# Patient Record
Sex: Female | Born: 1960
Health system: Southern US, Community
[De-identification: ages and names within clinical notes are randomized; demographics above are authoritative.]

## PROBLEM LIST (undated history)

## (undated) DIAGNOSIS — Z87898 Personal history of other specified conditions: Secondary | ICD-10-CM

## (undated) DIAGNOSIS — R87618 Other abnormal cytological findings on specimens from cervix uteri: Secondary | ICD-10-CM

## (undated) DIAGNOSIS — Z8619 Personal history of other infectious and parasitic diseases: Secondary | ICD-10-CM

## (undated) DIAGNOSIS — I10 Essential (primary) hypertension: Secondary | ICD-10-CM

## (undated) HISTORY — DX: Personal history of other infectious and parasitic diseases: Z86.19

## (undated) HISTORY — DX: Other abnormal cytological findings on specimens from cervix uteri: R87.618

## (undated) HISTORY — PX: CHOLECYSTECTOMY: SHX55

## (undated) HISTORY — DX: Personal history of other specified conditions: Z87.898

---

## 1999-01-30 ENCOUNTER — Encounter: Payer: Self-pay | Admitting: Internal Medicine

## 1999-01-30 ENCOUNTER — Ambulatory Visit (HOSPITAL_COMMUNITY): Admission: RE | Admit: 1999-01-30 | Discharge: 1999-01-30 | Payer: Self-pay | Admitting: Internal Medicine

## 1999-02-12 ENCOUNTER — Ambulatory Visit (HOSPITAL_COMMUNITY): Admission: RE | Admit: 1999-02-12 | Discharge: 1999-02-12 | Payer: Self-pay | Admitting: Internal Medicine

## 1999-02-12 ENCOUNTER — Encounter: Payer: Self-pay | Admitting: Internal Medicine

## 2003-03-20 ENCOUNTER — Encounter: Payer: Self-pay | Admitting: Internal Medicine

## 2003-03-20 ENCOUNTER — Ambulatory Visit (HOSPITAL_COMMUNITY): Admission: RE | Admit: 2003-03-20 | Discharge: 2003-03-20 | Payer: Self-pay | Admitting: Internal Medicine

## 2004-06-21 HISTORY — PX: OTHER SURGICAL HISTORY: SHX169

## 2004-07-09 ENCOUNTER — Encounter (INDEPENDENT_AMBULATORY_CARE_PROVIDER_SITE_OTHER): Payer: Self-pay | Admitting: *Deleted

## 2004-07-09 ENCOUNTER — Observation Stay (HOSPITAL_COMMUNITY): Admission: EM | Admit: 2004-07-09 | Discharge: 2004-07-10 | Payer: Self-pay | Admitting: Emergency Medicine

## 2005-04-02 ENCOUNTER — Emergency Department (HOSPITAL_COMMUNITY): Admission: EM | Admit: 2005-04-02 | Discharge: 2005-04-02 | Payer: Self-pay | Admitting: Emergency Medicine

## 2005-05-11 ENCOUNTER — Other Ambulatory Visit: Admission: RE | Admit: 2005-05-11 | Discharge: 2005-05-11 | Payer: Self-pay | Admitting: Obstetrics and Gynecology

## 2005-11-24 ENCOUNTER — Ambulatory Visit: Payer: Self-pay | Admitting: Hematology & Oncology

## 2005-12-28 LAB — HYPERCOAGULABLE PANEL, COMPREHENSIVE
AntiThromb III Func: 89 % (ref 75–120)
Anticardiolipin IgA: <7 [APL'U] (ref <13)
Anticardiolipin IgG: <7 [GPL'U] (ref <11)
Anticardiolipin IgM: 14 [MPL'U] (ref <10)
Beta-2 Glyco I IgG: 4 U/mL (ref <20)
Beta-2-Glycoprotein I IgA: <4 U/mL (ref <10)
Beta-2-Glycoprotein I IgM: <4 U/mL (ref <10)
DRVVT: 39.2 secs (ref 26.75–42.95)
Homocysteine: 13.6 umol/L (ref 4.0–15.4)
PTT Lupus Anticoagulant: 41.3 secs (ref 30.5–43.1)
Protein C Activity: 144 % (ref 91–147)
Protein C, Total: 76 % (ref 63–153)
Protein S Activity: 80 % — ABNORMAL LOW (ref 81–180)
Protein S Ag, Total: 85 % (ref 58–146)

## 2006-06-21 DIAGNOSIS — R87618 Other abnormal cytological findings on specimens from cervix uteri: Secondary | ICD-10-CM

## 2006-06-21 HISTORY — DX: Other abnormal cytological findings on specimens from cervix uteri: R87.618

## 2009-10-13 ENCOUNTER — Emergency Department (HOSPITAL_COMMUNITY): Admission: EM | Admit: 2009-10-13 | Discharge: 2009-10-13 | Payer: Self-pay | Admitting: Family Medicine

## 2010-11-06 NOTE — Op Note (Signed)
NAMECHONTEL, WARNING NO.:  [id number]   MEDICAL RECORD NO.:  [id number]          PATIENT TYPE:  INP   LOCATION:  0101                         FACILITY:  [facility]   PHYSICIAN:  Angelia Mould. Derrell Lolling, M.D.DATE OF BIRTH:  [birth year]   DATE OF PROCEDURE:  07/09/2004  DATE OF DISCHARGE:                                 OPERATIVE REPORT   PREOPERATIVE DIAGNOSIS:  Chronic cholecystitis with cholelithiasis.   POSTOPERATIVE DIAGNOSIS:  Chronic cholecystitis with cholelithiasis.   OPERATION/PROCEDURE:  Laparoscopic cholecystectomy with intraoperative  cholangiogram.   SURGEON:  Angelia Mould. Derrell Lolling, M.D.   FIRST ASSISTANT:  Currie Paris, M.D.   OPERATIVE INDICATIONS:  This is a 50 year old black female who presented to  emergency room with a several-hour history of severe epigastric and right  upper quadrant, nausea and vomiting.  The pain improved somewhat when given  narcotic analgesics but did not resolve.  She was found to have tenderness  in the right upper quadrant although not severe.  Liver function tests and  white blood cell count and lipase are normal.  Gallbladder ultrasound shows  numerous gallstones and a normal common bile duct.  She was observed for  several hours and continued to have pain. She was admitted to the hospital  and brought to the operating room.   OPERATIVE FINDINGS:  The gallbladder was edematous and acutely inflamed.  It  was very dense with intense inflammatory reaction making the dissection very  slow and tedious.  It was filled with stones of varying sizes up to 1.5 cm  in size.  The cholangiogram showed normal intrahepatic and extrahepatic  biliary anatomy, no leak, no obstruction, prompt flow of contrast into the  duodenum and probably no filling defects.  There was one transient filling  defect in the common hepatic duct which we thought was air bubbles and so  did Dr. Ronney Asters in radiology.  We felt nothing further needed to be  done  about that.  The liver was otherwise healthy.  The small bowel and large  bowel, stomach, duodenum and peritoneal surfaces looked normal to  inspection.   DESCRIPTION OF PROCEDURE:  Following the induction of general endotracheal  anesthesia, the patient's abdomen was prepped and draped in the sterile  fashion.  Marcaine 0.5% with epinephrine was used as a local infiltration  anesthetic.  A vertically oriented incision was made inside the lower rim of  the umbilicus.  The fascia was incised in the midline and the abdominal  cavity entered under direct vision.  A 10 mm Hasson trocar was inserted and  secured with a pursestring of 0 Vicryl.  Pneumoperitoneum was created.  Video camera was inserted with visualization and findings as described  above.   A 10 mm trocar was placed in the subxiphoid region and two 5 mm trocars were  placed in the right mid abdomen.  We ultimately had to place a third 5 mm  trocar in the right upper quadrant because of the patient's very large body  habitus.  The gallbladder fundus was carefully identified and elevated. We  then identified the  infundibulum of the gallbladder and dissected adhesions  away from this.  We dissected out the cystic duct creating a nice window  behind it.  A metal clip was placed on the cystic duct close to the  gallbladder.  The cystic duct was opened and a cholangiogram catheter was  inserted.  The cholangiogram was performed with unremarkable findings as  described above.  We had no leak, no obstruction, no clearcut filling  defects, and good anatomy both inside and outside the liver.  The  cholangiogram was removed.  The cystic duct was secured with multiple metal  clips and divided.  We carefully dissected out the cystic artery, controlled  it with multiple metal clips and divided close to the gallbladder.  The  gallbladder was dissected from its bed with electrocautery, placed in a  specimen bag and removed.  The  operative field was copiously irrigated.  We  did have a little bit of bleeding in the gallbladder bed and we also had a  small capsular tear of the dome of the liver, both of which had to be  controlled with cautery and we placed a piece of Surgicel on the dome of the  liver as well and that seemed to control the bleeding quite nicely.  At the  end of the case there was no bleeding and the irrigation fluid was  completely clear.  No bile leak either.  The trocars were removed under  direct vision.  There was no bleeding from the trocar sites.  The  pneumoperitoneum was released.  The fascia at the umbilicus was closed with  0 Vicryl sutures.  Skin incisions were closed with subcuticular sutures of 4-  0 Vicryl and Steri-Strips.  Kling bandages were placed.  The patient was  taken to the recovery room in stable condition.  Estimated blood loss was  about 40 mL.  Complications were none.  Sponge, needle and instrument counts  were correct.      HMI/MEDQ  D:  07/09/2004  T:  07/09/2004  Job:  [id number]   cc:   Candyce Churn. Allyne Gee, M.D.  [address]  [address]  [place]  [place] [address]  Fax: [phone]

## 2010-11-06 NOTE — H&P (Signed)
Donna Crawford, Donna Crawford NO.:  [id number]   MEDICAL RECORD NO.:  [id number]          PATIENT TYPE:  EMS   LOCATION:  ED                           FACILITY:  [facility]   PHYSICIAN:  Angelia Mould. Derrell Lolling, M.D.DATE OF BIRTH:  10/12/1960   DATE OF ADMISSION:  07/09/2004  DATE OF DISCHARGE:                                HISTORY & PHYSICAL   CHIEF COMPLAINT:  Abdominal pain and nausea.   HISTORY OF PRESENT ILLNESS:  This is a 50 year old black female who came  home from work last night, and at about 1:00 a.m. noted the rather sudden  onset of epigastric and right upper quadrant pain.  She said this was the  worst pain she ever had.  She vomited four times, had one small soft bowel  movement, and came to the emergency room at about 4:30 in the morning.  She  thinks she had a similar episode about one week ago, but it was much milder.   While in the ER, she has been given analgesics and states that the pain is  now about 4/10.  Her nausea has resolved.  She still has pain.  She is  currently on her menstrual cycle.  She is otherwise doing reasonably well.  She denies any history of liver disease, cardiovascular disease, urologic  disease, or lung disease.   While in the emergency room, she has had an ultrasound which showed numerous  gallstones but no acute changes.  Common bile duct measured 5.8 mm.   PAST HISTORY:  She has had oral surgery on her gums.  Otherwise, no surgery  or medical problems other than obesity.  She states she weighs 305 pounds.   CURRENT MEDICATIONS:  Hydrochlorothiazide to treat ankle edema.   DRUG ALLERGIES:  None known.   SOCIAL HISTORY:  She lives in [place].  She is single.  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  She has no  children.  She denies the use of alcohol or tobacco.   FAMILY HISTORY:  Mother living, has diabetes.  Father deceased of prostate  cancer.  Six siblings.  One sister died of  metastatic breast cancer.   REVIEW OF SYSTEMS:  A 15-system review of systems was performed.  It is  noncontributory, except as described above.   PHYSICAL EXAMINATION:  GENERAL:  Very pleasant middle-aged black female.  She is morbidly obese, in mild distress.  TEMPERATURE:  98.1.  BLOOD PRESSURE: 113/77.  PULSE:  77.  RESPIRATIONS:  18.  O2 SATURATION:  Between 97-100% on room air.  EYES:  Sclerae clear.  Extraocular movements intact.  EARS, NOSE, MOUTH, AND THROAT:  Nose, lips, tongue, and oropharynx without  gross lesions.  NECK:  Supple, nontender.  No mass.  LUNGS:  Clear to auscultation.  No chest wall tenderness.  HEART:  Regular rate and rhythm.  No murmur.  Radial and femoral pulses are  palpable.  She does have some ankle edema, and I cannot feel her pedal  pulses, but the feet are warm and well vascularized.  BREASTS:  Not  examined.  ABDOMEN:  Significantly obese.  No scars.  No palpable mass.  Liver and  spleen not obviously enlarged.  She is mildly tender in the epigastrium and  right upper quadrant, but there is no mass and no guarding.  EXTREMITIES:  She moves all four extremities well, without pain or  deformity.  Her ankles are somewhat thick, with mild edema.  NEUROLOGIC:  No gross motor or sensory deficits.   ADMISSION DATA:  Ultrasound shows gallstones and a normal common bile duct.  Hemoglobin 12.2, white count 7,600.  Potassium 3.4.  Liver function tests  normal.  BUN 9, creatinine 1, lipase 15.  Urinalysis shows a little bit of  blood, consistent with her menstrual period, but no white cells.   ASSESSMENT:  1.  Acute cholecystitis with cholelithiasis.  2.  Morbid obesity.   PLAN:  The patient will be admitted for a laparoscopic cholecystectomy.  This was her desire, to go ahead with surgery at this time rather than defer  to a later date.  I feel that this is medically appropriate.   I have discussed the indications and details of surgery with her.   Risks and  complications have been outlined, including but not limited to bleeding,  infection, conversion to open laparotomy, injury to adjacent organs such as  the main bile duct or intestine with major reconstructive surgery, wound  problems such as infection or hernia, cardiac, pulmonary, and thromboembolic  problems.  She seems to understand these issues well.  At this time, all her  questions were answered.  She would very much like to go ahead with this  plan today.      HMI/MEDQ  D:  07/09/2004  T:  07/09/2004  Job:  [id number]   cc:   Candyce Churn. Allyne Gee, M.D.  [address]  [address]  [place]  [place] [address]  Fax: [phone]

## 2011-04-13 ENCOUNTER — Inpatient Hospital Stay (INDEPENDENT_AMBULATORY_CARE_PROVIDER_SITE_OTHER)
Admission: RE | Admit: 2011-04-13 | Discharge: 2011-04-13 | Disposition: A | Discharge status: Home / Self Care | Payer: 59 | Source: Ambulatory Visit | Attending: Family Medicine | Admitting: Family Medicine

## 2011-04-13 DIAGNOSIS — IMO0002 Reserved for concepts with insufficient information to code with codable children: Secondary | ICD-10-CM

## 2011-11-05 ENCOUNTER — Other Ambulatory Visit: Payer: Self-pay | Admitting: Internal Medicine

## 2011-11-05 DIAGNOSIS — Z1231 Encounter for screening mammogram for malignant neoplasm of breast: Secondary | ICD-10-CM

## 2011-12-27 ENCOUNTER — Ambulatory Visit (INDEPENDENT_AMBULATORY_CARE_PROVIDER_SITE_OTHER): Payer: 59 | Admitting: Obstetrics and Gynecology

## 2011-12-27 ENCOUNTER — Encounter: Payer: Self-pay | Admitting: Obstetrics and Gynecology

## 2011-12-27 VITALS — BP 110/76 | Temp 98.3°F | Resp 16 | Ht 64.0 in | Wt 326.0 lb

## 2011-12-27 DIAGNOSIS — Z0000 Encounter for general adult medical examination without abnormal findings: Secondary | ICD-10-CM

## 2011-12-27 DIAGNOSIS — R8789 Other abnormal findings in specimens from female genital organs: Secondary | ICD-10-CM

## 2011-12-27 DIAGNOSIS — R87618 Other abnormal cytological findings on specimens from cervix uteri: Secondary | ICD-10-CM

## 2011-12-27 DIAGNOSIS — B977 Papillomavirus as the cause of diseases classified elsewhere: Secondary | ICD-10-CM

## 2011-12-27 NOTE — Progress Notes (Signed)
Last Pap: 05/2006 WNL: Yes Regular Periods:yes Contraception: None  Monthly Breast exam:yes Tetanus<28yrs:no Nl.Bladder Function:yes Daily BMs:yes Healthy Diet: sometimes Calcium:yes Mammogram:yes Date of Mammogram: 2008 Exercise:yes Have often Exercise: 2-3 times weekly Seatbelt: yes Abuse at home: no Stressful work:yes Sigmoid-colonoscopy: Never had Bone Density: Never had PCP: Dr. Allyne Gee Change in PMH: cholesterol levels and thyroid Change in [facility]: None  *Pt without complaints today Pt without complaints Physical Examination: General appearance - alert, well appearing, and in no distress Mental status - normal mood, behavior, speech, dress, motor activity, and thought processes Neck - supple, no significant adenopathy, thyroid exam: thyroid is normal in size without nodules or tenderness Chest - clear to auscultation, no wheezes, rales or rhonchi, symmetric air entry Heart - normal rate and regular rhythm Abdomen - soft, nontender, nondistended, no masses or organomegaly Breasts - breasts appear normal, pendulous, no suspicious masses, no skin or nipple changes or axillary nodes Pelvic - normal external genitalia, vulva, vagina, cervix, uterus and adnexa. Difficult to tell the size of uterus b/c of body habitus Rectal - normal rectal, no masses, rectal exam not indicated Back exam - full range of motion, no tenderness, palpable spasm or pain on motion Neurological - alert, oriented, normal speech, no focal findings or movement disorder noted Musculoskeletal - no joint tenderness, deformity or swelling Extremities - no edema, redness or tenderness in the calves or thighs Skin - normal coloration and turgor, no rashes, no suspicious skin lesions noted Routine exam Pap sent yes Mammogram due yes wil schedule pt with endometrial cells on pap in 2008.  she did not have embx done.  she declines i no but will do [place] @[place]  RT 4 weks

## 2011-12-27 NOTE — Addendum Note (Signed)
Addended by: Rolla Plate on: 12/27/2011 03:55 PM   Modules accepted: Orders

## 2011-12-28 LAB — PAP IG W/ RFLX HPV ASCU

## 2012-01-13 ENCOUNTER — Encounter: Payer: 59 | Admitting: Obstetrics and Gynecology

## 2012-01-13 ENCOUNTER — Other Ambulatory Visit: Payer: 59

## 2012-12-27 ENCOUNTER — Other Ambulatory Visit: Payer: Self-pay | Admitting: Gastroenterology

## 2013-01-26 ENCOUNTER — Ambulatory Visit (HOSPITAL_COMMUNITY)
Admission: RE | Admit: 2013-01-26 | Discharge: 2013-01-26 | Disposition: A | Discharge status: Home / Self Care | Payer: 59 | Source: Ambulatory Visit | Attending: Gastroenterology | Admitting: Gastroenterology

## 2013-01-26 ENCOUNTER — Encounter (HOSPITAL_COMMUNITY): Payer: Self-pay | Admitting: *Deleted

## 2013-01-26 ENCOUNTER — Encounter (HOSPITAL_COMMUNITY)
Admission: RE | Disposition: A | Discharge status: Home / Self Care | Payer: Self-pay | Source: Ambulatory Visit | Attending: Gastroenterology

## 2013-01-26 DIAGNOSIS — Z9089 Acquired absence of other organs: Secondary | ICD-10-CM | POA: Insufficient documentation

## 2013-01-26 DIAGNOSIS — K644 Residual hemorrhoidal skin tags: Secondary | ICD-10-CM | POA: Insufficient documentation

## 2013-01-26 DIAGNOSIS — I10 Essential (primary) hypertension: Secondary | ICD-10-CM | POA: Insufficient documentation

## 2013-01-26 DIAGNOSIS — D126 Benign neoplasm of colon, unspecified: Secondary | ICD-10-CM | POA: Insufficient documentation

## 2013-01-26 DIAGNOSIS — K5730 Diverticulosis of large intestine without perforation or abscess without bleeding: Secondary | ICD-10-CM | POA: Insufficient documentation

## 2013-01-26 DIAGNOSIS — Z1211 Encounter for screening for malignant neoplasm of colon: Secondary | ICD-10-CM | POA: Insufficient documentation

## 2013-01-26 DIAGNOSIS — K648 Other hemorrhoids: Secondary | ICD-10-CM | POA: Insufficient documentation

## 2013-01-26 HISTORY — PX: COLONOSCOPY: SHX5424

## 2013-01-26 HISTORY — DX: Essential (primary) hypertension: I10

## 2013-01-26 SURGERY — COLONOSCOPY
Anesthesia: Moderate Sedation

## 2013-01-26 MED ORDER — MIDAZOLAM HCL 10 MG/2ML IJ SOLN
INTRAMUSCULAR | Status: AC
  Filled 2013-01-26: qty 4

## 2013-01-26 MED ORDER — SODIUM CHLORIDE 0.9 % IV SOLN
INTRAVENOUS | Status: DC
  Administered 2013-01-26: 500 mL via INTRAVENOUS

## 2013-01-26 MED ORDER — DIPHENHYDRAMINE HCL 50 MG/ML IJ SOLN
INTRAMUSCULAR | Status: AC
  Filled 2013-01-26: qty 1

## 2013-01-26 MED ORDER — FENTANYL CITRATE 0.05 MG/ML IJ SOLN
INTRAMUSCULAR | Status: DC | PRN
  Administered 2013-01-26 (×3): 25 ug via INTRAVENOUS

## 2013-01-26 MED ORDER — FENTANYL CITRATE 0.05 MG/ML IJ SOLN
INTRAMUSCULAR | Status: AC
  Filled 2013-01-26: qty 4

## 2013-01-26 MED ORDER — MIDAZOLAM HCL 5 MG/5ML IJ SOLN
INTRAMUSCULAR | Status: DC | PRN
  Administered 2013-01-26 (×3): 2 mg via INTRAVENOUS

## 2013-01-26 NOTE — Op Note (Signed)
[facility] [address] [place], [address]   OPERATIVE PROCEDURE REPORT  PATIENT: Donna Crawford, Donna Crawford  MR#: [id number] BIRTHDATE: [birth year]  GENDER: Female ENDOSCOPIST: Jeani Hawking, MD ASSISTANT:   Priscella Mann, technician Karoline Caldwell, RN, BSN PROCEDURE DATE: 01/26/2013 PROCEDURE:   Colonoscopy with snare polypectomy ASA CLASS:   Class III INDICATIONS:Screening MEDICATIONS: Versed 6 mg IV and Fentanyl 75 mcg IV  DESCRIPTION OF PROCEDURE:   After the risks benefits and alternatives of the procedure were thoroughly explained, informed consent was obtained.  A digital rectal exam revealed no abnormalities of the rectum.    The Pentax Colonoscope [id number] endoscope was introduced through the anus  and advanced to the cecum, which was identified by both the appendix and ileocecal valve , No adverse events experienced.    The quality of the prep was excellent. .  The instrument was then slowly withdrawn as the colon was fully examined.     FINDINGS: A 3 mm sessile sigmoid colon polyp was removed with a cold snare.  Scattered left sided diverticula were noted.  No evidence of any masses, inflammation, ulcerations, or erosions.          The scope was then withdrawn from the patient and the procedure terminated.  COMPLICATIONS: There were no complications.  IMPRESSION: 1) Polyp. 2) Diverticula. 3) Int/Ext Hemorrhoids.  RECOMMENDATIONS: 1) Await biopsy results. 2) Repeat the colonoscopy in 5-10 years.  _______________________________ eSignedJeani Hawking, MD 01/26/2013 11:06 AM

## 2013-01-26 NOTE — H&P (Signed)
Reason for Consult: Screening colonoscopy Referring Physician: Dorothyann Peng, M.D.  Arlean Hopping HPI: This is a 52 year old female referred for a screening colonoscopy.  She is asymptomatic from the GI standpoint.  The patient is here today at the hospital because her BMI is at 53.  Past Medical History  Diagnosis Date  . H/O varicella   . History of measles, mumps, or rubella   . Non-atypical endometrial cells on cervical Pap smear 2008  . Hypertension     Past Surgical History  Procedure Laterality Date  . Gall bladder removed  2006  . Cholecystectomy      History reviewed. No pertinent family history.  Social History:  reports that she has never smoked. She has never used smokeless tobacco. She reports that  drinks alcohol. She reports that she does not use illicit drugs.  Allergies: No Known Allergies  Medications:  Scheduled:  Continuous: . sodium chloride 500 mL (01/26/13 0920)    No results found for this or any previous visit (from the past 24 hour(s)).   No results found.  ROS:  As stated above in the HPI otherwise negative.  Blood pressure 122/78, temperature 98.4 F (36.9 C), temperature source Oral, resp. rate 15, height 5\' 4"  (1.626 m), weight 324 lb (146.965 kg), last menstrual period 12/03/2012, SpO2 97.00%.    PE: Gen: NAD, Alert and Oriented HEENT:  [place]/AT, EOMI Neck: Supple, no LAD Lungs: CTA Bilaterally CV: RRR without M/G/R ABM: Soft, NTND, +BS Ext: No C/C/E  Assessment/Plan: 1) Screening colonoscopy.  Plan: 1) Colonoscopy today.  Demaria Deeney D 01/26/2013, 10:05 AM

## 2013-01-29 ENCOUNTER — Encounter (HOSPITAL_COMMUNITY): Payer: Self-pay | Admitting: Gastroenterology

## 2013-11-29 ENCOUNTER — Encounter: Payer: 59 | Attending: Internal Medicine | Admitting: Dietician

## 2013-11-29 ENCOUNTER — Encounter: Payer: Self-pay | Admitting: Dietician

## 2013-11-29 DIAGNOSIS — E119 Type 2 diabetes mellitus without complications: Secondary | ICD-10-CM | POA: Insufficient documentation

## 2013-11-29 DIAGNOSIS — Z713 Dietary counseling and surveillance: Secondary | ICD-10-CM | POA: Insufficient documentation

## 2013-11-29 NOTE — Progress Notes (Signed)
  Medical Nutrition Therapy:  Appt start time: 1115 end time:  1200.   Assessment:  Primary concerns today: Donna Crawford is here today stating that she is interested in improving her health overall. Diabetes runs in her family and she would like to prevent it. 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 She starts nursing school in the fall. Does not eat out as much lately and cooks more.   Preferred Learning Style:   No preference indicated   Learning Readiness:   Ready   MEDICATIONS: see list   DIETARY INTAKE:  24-hr recall:  B ( AM): skips  Snk ( AM):   L ( PM): cabbage, potatoes, and sausage or chili (tries to always incorporate a vegetable) Snk ( PM): frozen yogurt from Donna Crawford D ( PM):  Snk ( PM):   Beverages: soda rarely, water with flavoring, tea with Stevia  Usual physical activity: none  Estimated energy needs: 1600-1800 calories 180-200 g carbohydrates 120-135 g protein 44-50 g fat  Progress Towards Goal(s):  No progress.   Nutritional Diagnosis:  Donna Crawford-3.3 Overweight/obesity As related to sedentary lifestyle and excessive energy intake  As evidenced by BMI 54.    Intervention:  Nutrition counseling provided.  Teaching Method Utilized:  Visual Auditory Hands on  Handouts given during visit include:  MyPlate  65Y CHO + protein snacks  Barriers to learning/adherence to lifestyle change: schedule  Demonstrated degree of understanding via:  Teach Back   Monitoring/Evaluation:  Dietary intake, exercise, and body weight prn.

## 2013-11-29 NOTE — Patient Instructions (Addendum)
-  Start exercise routine!  -Walk outside  -Listen to music  -Goal: 3x a week for 30 min - increase time every week  -Consider wearing a pedometer and set a step goal  -Start eating breakfast every day  -Have something with a carb and a protein (refer to snack list)  -Fill up on non-starchy vegetables (any veggie that is not corn, peas, or potatoes)  -Carbs per day: 180-200g per day (about 3 servings per meal) -1 serving of carbs = 15 grams -Choose carbs high in fiber and low in sugar  -Pre-portion snacks -Continue not buying "trigger foods"

## 2015-06-14 ENCOUNTER — Ambulatory Visit (INDEPENDENT_AMBULATORY_CARE_PROVIDER_SITE_OTHER): Payer: 59 | Admitting: Family Medicine

## 2015-06-14 VITALS — BP 132/84 | HR 61 | Temp 97.5°F | Resp 18 | Ht 65.0 in | Wt 305.0 lb

## 2015-06-14 DIAGNOSIS — H6122 Impacted cerumen, left ear: Secondary | ICD-10-CM | POA: Diagnosis not present

## 2015-06-14 DIAGNOSIS — J029 Acute pharyngitis, unspecified: Secondary | ICD-10-CM

## 2015-06-14 MED ORDER — AMOXICILLIN 875 MG PO TABS: 875.0000 mg | ORAL_TABLET | Freq: Two times a day (BID) | ORAL | Status: DC

## 2015-06-14 NOTE — Progress Notes (Signed)
This chart was scribed for Robyn Haber, MD by [facility], medical scribe at [facility].The patient was seen in exam room 14 and the patient's care was started at 11:17 AM.  Patient ID: Donna Crawford MRN: [id number], DOB: 1961/02/28, 54 y.o. Date of Encounter: 06/14/2015  Primary Physician: [name], MD  Chief Complaint:  Chief Complaint  Patient presents with  . Sore Throat    last night very bad    HPI:  Donna Crawford is a 54 y.o. female who presents to [facility] complaining of sore throat since last night, taking tylenol for relief. No ear pain, but feels as though her left ear is obstructed with wax. 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   Past Medical History  Diagnosis Date  . H/O varicella   . History of measles, mumps, or rubella   . Non-atypical endometrial cells on cervical Pap smear 2008  . Hypertension     Home Meds: Prior to Admission medications   Medication Sig Start Date End Date Taking? Authorizing Provider  Cholecalciferol (VITAMIN D PO) Take by mouth.   Yes Historical Provider, MD  Javier Docker Oil 500 MG CAPS Take 600 mg by mouth daily.   Yes Historical Provider, MD  Melatonin 3 MG TABS Take by mouth.   Yes Historical Provider, MD  olmesartan-hydrochlorothiazide (BENICAR HCT) 20-12.5 MG per tablet Take 1 tablet by mouth daily.   Yes Historical Provider, MD   Allergies: No Known Allergies  Social History   Social History  . Marital Status: Married    Spouse Name: N/A  . Number of Children: N/A  . Years of Education: N/A   Occupational History  . Not on file.   Social History Main Topics  . Smoking status: Never Smoker   . Smokeless tobacco: Never Used  . Alcohol Use: Yes     Comment: Occasional wine  . Drug Use: No  . Sexual Activity: Not Currently    Birth Control/ Protection: Abstinence, None   Other Topics Concern  . Not on file   Social History Narrative    Review of  Systems: Constitutional: negative for chills, fever, night sweats, weight changes, or fatigue  HEENT: negative for vision changes, hearing loss, congestion, rhinorrhea, epistaxis, or sinus pressure. Positive for sore throat. Cardiovascular: negative for chest pain or palpitations Respiratory: negative for hemoptysis, wheezing, shortness of breath, or cough Abdominal: negative for abdominal pain, nausea, vomiting, diarrhea, or constipation Dermatological: negative for rash Neurologic: negative for headache, dizziness, or syncope All other systems reviewed and are otherwise negative with the exception to those above and in the HPI.  Physical Exam:  Blood pressure 132/84, pulse 61, temperature 97.5 F (36.4 C), temperature source Oral, resp. rate 18, height 5\' 5"  (1.651 m), weight 305 lb (138.347 kg), last menstrual period 12/03/2012, SpO2 97 %., Body mass index is 50.75 kg/(m^2). General: Well developed, well nourished, in no acute distress. Head: Normocephalic, atraumatic, eyes without discharge, sclera non-icteric, nares are without discharge. Left TM is obstructed with cerumen. Throat is erythematous.  Neck: Supple. No thyromegaly. Full ROM. No lymphadenopathy. Lungs: Clear bilaterally to auscultation without wheezes, rales, or rhonchi. Breathing is unlabored. Heart: RRR with S1 S2. No murmurs, rubs, or gallops appreciated. Abdomen: Soft, non-tender, non-distended with normoactive bowel sounds. No hepatomegaly. No rebound/guarding. No obvious abdominal masses. Msk:  Strength and tone normal for age. Extremities/Skin: Warm and dry. No clubbing or cyanosis. No edema. No rashes or  suspicious lesions. Neuro: Alert and oriented X 3. Moves all extremities spontaneously. Gait is normal. CNII-XII grossly in tact. Psych:  Responds to questions appropriately with a normal affect.    ASSESSMENT AND PLAN:  55 y.o. year old female with sore throat and left cerumen impaction  By signing my name below,  I, Nadim Abuhashem, attest that this documentation has been prepared under the direction and in the presence of Robyn Haber, MD.  Electronically Signed: Lora Havens, medical scribe. 06/14/2015 11:22 AM.  This chart was scribed in my presence and reviewed by me personally.    ICD-9-CM ICD-10-CM   1. Sore throat 462 J02.9 amoxicillin (AMOXIL) 875 MG tablet     Culture, Group A Strep  2. Cerumen impaction, left 380.4 H61.22      Signed, Robyn Haber, MD

## 2015-06-14 NOTE — Patient Instructions (Signed)

## 2015-06-14 NOTE — Addendum Note (Signed)
Addended by: Constance Goltz on: 06/14/2015 12:06 PM   Modules accepted: Miquel Dunn

## 2015-06-15 LAB — CULTURE, GROUP A STREP: Organism ID, Bacteria: NORMAL

## 2015-08-22 MED FILL — VITAMIN D 2,000 UNIT SOFTGE: 50 MCG | 30 days supply | Qty: 60 | Fill #0

## 2015-09-02 MED FILL — HYDROCODON-APAP 5-325: 5-325 | 3 days supply | Qty: 13 | Fill #0

## 2015-09-02 MED FILL — IBUPROFEN 800 MG TABLET: 800 | 4 days supply | Qty: 16 | Fill #0

## 2015-09-17 DIAGNOSIS — N182 Chronic kidney disease, stage 2 (mild): Secondary | ICD-10-CM | POA: Diagnosis not present

## 2015-09-17 DIAGNOSIS — I129 Hypertensive chronic kidney disease with stage 1 through stage 4 chronic kidney disease, or unspecified chronic kidney disease: Secondary | ICD-10-CM | POA: Diagnosis not present

## 2015-09-17 DIAGNOSIS — M549 Dorsalgia, unspecified: Secondary | ICD-10-CM | POA: Diagnosis not present

## 2015-09-30 MED FILL — OLMESARTAN-HCTZ 20-12.5 MG: 20-12.5 | 90 days supply | Qty: 90 | Fill #1

## 2015-11-11 DIAGNOSIS — I129 Hypertensive chronic kidney disease with stage 1 through stage 4 chronic kidney disease, or unspecified chronic kidney disease: Secondary | ICD-10-CM | POA: Diagnosis not present

## 2015-11-11 DIAGNOSIS — Z0000 Encounter for general adult medical examination without abnormal findings: Secondary | ICD-10-CM | POA: Diagnosis not present

## 2015-11-11 DIAGNOSIS — N182 Chronic kidney disease, stage 2 (mild): Secondary | ICD-10-CM | POA: Diagnosis not present

## 2015-11-11 DIAGNOSIS — Z6841 Body Mass Index (BMI) 40.0 and over, adult: Secondary | ICD-10-CM | POA: Diagnosis not present

## 2016-01-06 MED FILL — OLMESARTAN-HCTZ 20-12.5 MG: 20-12.5 | 90 days supply | Qty: 90 | Fill #0

## 2016-01-12 DIAGNOSIS — R5383 Other fatigue: Secondary | ICD-10-CM | POA: Diagnosis not present

## 2016-01-12 DIAGNOSIS — M545 Low back pain: Secondary | ICD-10-CM | POA: Diagnosis not present

## 2016-01-12 DIAGNOSIS — N182 Chronic kidney disease, stage 2 (mild): Secondary | ICD-10-CM | POA: Diagnosis not present

## 2016-01-12 DIAGNOSIS — I129 Hypertensive chronic kidney disease with stage 1 through stage 4 chronic kidney disease, or unspecified chronic kidney disease: Secondary | ICD-10-CM | POA: Diagnosis not present

## 2016-01-13 MED FILL — CYCLOBENZAPRINE 10 MG TAB: 10 | 30 days supply | Qty: 30 | Fill #0

## 2016-04-15 MED FILL — OLMESARTAN-HCTZ 20-12.5 MG: 20-12.5 | 90 days supply | Qty: 90 | Fill #1

## 2016-04-15 MED FILL — VITAMIN D 2,000 UNIT SOFTGE: 50 MCG | 30 days supply | Qty: 60 | Fill #1

## 2016-05-06 DIAGNOSIS — I129 Hypertensive chronic kidney disease with stage 1 through stage 4 chronic kidney disease, or unspecified chronic kidney disease: Secondary | ICD-10-CM | POA: Diagnosis not present

## 2016-05-06 DIAGNOSIS — R7309 Other abnormal glucose: Secondary | ICD-10-CM | POA: Diagnosis not present

## 2016-05-06 DIAGNOSIS — N182 Chronic kidney disease, stage 2 (mild): Secondary | ICD-10-CM | POA: Diagnosis not present

## 2016-05-06 DIAGNOSIS — Z6841 Body Mass Index (BMI) 40.0 and over, adult: Secondary | ICD-10-CM | POA: Diagnosis not present

## 2016-05-18 ENCOUNTER — Other Ambulatory Visit: Payer: Self-pay | Admitting: Internal Medicine

## 2016-05-18 DIAGNOSIS — Z1231 Encounter for screening mammogram for malignant neoplasm of breast: Secondary | ICD-10-CM

## 2016-05-19 DIAGNOSIS — X500XXA Overexertion from strenuous movement or load, initial encounter: Secondary | ICD-10-CM | POA: Diagnosis not present

## 2016-05-19 DIAGNOSIS — M549 Dorsalgia, unspecified: Secondary | ICD-10-CM | POA: Diagnosis not present

## 2016-05-19 DIAGNOSIS — Y9269 Other specified industrial and construction area as the place of occurrence of the external cause: Secondary | ICD-10-CM | POA: Diagnosis not present

## 2016-06-24 ENCOUNTER — Ambulatory Visit: Payer: 59

## 2016-06-30 MED FILL — VITAMIN D 2,000 UNIT SOFTGE: 50 MCG | 30 days supply | Qty: 60 | Fill #2

## 2016-07-21 ENCOUNTER — Ambulatory Visit: Payer: 59

## 2016-08-11 MED FILL — OLMESARTAN-HCTZ 20-12.5 MG: 20-12.5 | 90 days supply | Qty: 90 | Fill #0

## 2016-08-13 ENCOUNTER — Ambulatory Visit: Payer: 59

## 2016-09-03 ENCOUNTER — Ambulatory Visit: Payer: 59

## 2016-09-20 MED FILL — VITAMIN D 2,000 UNIT SOFTGE: 50 MCG | 50 days supply | Qty: 100 | Fill #0

## 2016-10-19 MED FILL — PENICILLIN VK 500 MG TABLET: 500 | 7 days supply | Qty: 28 | Fill #0

## 2016-11-22 DIAGNOSIS — S339XXA Sprain of unspecified parts of lumbar spine and pelvis, initial encounter: Secondary | ICD-10-CM | POA: Diagnosis not present

## 2016-11-22 MED FILL — METHOCARBAMOL 500 MG TABLET: 500 | 10 days supply | Qty: 30 | Fill #0

## 2016-11-23 DIAGNOSIS — I129 Hypertensive chronic kidney disease with stage 1 through stage 4 chronic kidney disease, or unspecified chronic kidney disease: Secondary | ICD-10-CM | POA: Diagnosis not present

## 2016-11-23 DIAGNOSIS — R0982 Postnasal drip: Secondary | ICD-10-CM | POA: Diagnosis not present

## 2016-11-23 DIAGNOSIS — M545 Low back pain: Secondary | ICD-10-CM | POA: Diagnosis not present

## 2016-11-23 DIAGNOSIS — N182 Chronic kidney disease, stage 2 (mild): Secondary | ICD-10-CM | POA: Diagnosis not present

## 2016-11-23 MED FILL — LEVOCETIRIZINE 5 MG TABLET: 5 | 30 days supply | Qty: 30 | Fill #0

## 2016-11-23 MED FILL — CYCLOBENZAPRINE 10 MG TABLE: 10 | 30 days supply | Qty: 30 | Fill #0

## 2016-12-24 MED FILL — OLMESARTAN-HCTZ 20-12.5 MG: 20-12.5 | 30 days supply | Qty: 30 | Fill #1

## 2017-02-03 MED FILL — OLMESARTAN-HCTZ 20-12.5 MG: 20-12.5 | 90 days supply | Qty: 90 | Fill #0

## 2017-02-15 MED FILL — VITAMIN D 2,000 UNIT SOFTGE: 50 MCG | 50 days supply | Qty: 100 | Fill #1

## 2017-03-23 DIAGNOSIS — Z0000 Encounter for general adult medical examination without abnormal findings: Secondary | ICD-10-CM | POA: Diagnosis not present

## 2017-03-23 DIAGNOSIS — I129 Hypertensive chronic kidney disease with stage 1 through stage 4 chronic kidney disease, or unspecified chronic kidney disease: Secondary | ICD-10-CM | POA: Diagnosis not present

## 2017-03-23 DIAGNOSIS — N182 Chronic kidney disease, stage 2 (mild): Secondary | ICD-10-CM | POA: Diagnosis not present

## 2017-03-23 DIAGNOSIS — Z113 Encounter for screening for infections with a predominantly sexual mode of transmission: Secondary | ICD-10-CM | POA: Diagnosis not present

## 2017-03-23 DIAGNOSIS — Z1231 Encounter for screening mammogram for malignant neoplasm of breast: Secondary | ICD-10-CM | POA: Diagnosis not present

## 2017-05-19 MED FILL — OLMESARTAN-HCTZ 20-12.5 MG: 20-12.5 | 90 days supply | Qty: 90 | Fill #1

## 2017-05-19 MED FILL — VITAMIN D 2,000 UNIT SOFTGE: 50 MCG | 50 days supply | Qty: 100 | Fill #2

## 2017-06-15 DIAGNOSIS — H1131 Conjunctival hemorrhage, right eye: Secondary | ICD-10-CM | POA: Diagnosis not present

## 2017-06-29 MED FILL — SM VITAMIN D3 2,000 UNIT SF: 50 MCG | 50 days supply | Qty: 100 | Fill #3

## 2017-06-30 DIAGNOSIS — H40013 Open angle with borderline findings, low risk, bilateral: Secondary | ICD-10-CM | POA: Diagnosis not present

## 2017-07-07 DIAGNOSIS — H43813 Vitreous degeneration, bilateral: Secondary | ICD-10-CM | POA: Diagnosis not present

## 2017-07-07 DIAGNOSIS — H40013 Open angle with borderline findings, low risk, bilateral: Secondary | ICD-10-CM | POA: Diagnosis not present

## 2017-09-05 ENCOUNTER — Ambulatory Visit (HOSPITAL_COMMUNITY)
Admission: EM | Admit: 2017-09-05 | Discharge: 2017-09-05 | Disposition: A | Discharge status: Home / Self Care | Payer: 59 | Attending: Internal Medicine | Admitting: Internal Medicine

## 2017-09-05 ENCOUNTER — Encounter (HOSPITAL_COMMUNITY): Payer: Self-pay | Admitting: Emergency Medicine

## 2017-09-05 DIAGNOSIS — B9789 Other viral agents as the cause of diseases classified elsewhere: Secondary | ICD-10-CM

## 2017-09-05 DIAGNOSIS — J069 Acute upper respiratory infection, unspecified: Secondary | ICD-10-CM | POA: Diagnosis not present

## 2017-09-05 DIAGNOSIS — I10 Essential (primary) hypertension: Secondary | ICD-10-CM | POA: Insufficient documentation

## 2017-09-05 DIAGNOSIS — Z79899 Other long term (current) drug therapy: Secondary | ICD-10-CM | POA: Diagnosis not present

## 2017-09-05 DIAGNOSIS — R05 Cough: Secondary | ICD-10-CM | POA: Diagnosis not present

## 2017-09-05 DIAGNOSIS — J029 Acute pharyngitis, unspecified: Secondary | ICD-10-CM | POA: Diagnosis present

## 2017-09-05 LAB — POCT RAPID STREP A: Streptococcus, Group A Screen (Direct): NEGATIVE

## 2017-09-05 MED ORDER — CROMOLYN SODIUM 5.2 MG/ACT NA AERS: 1.0000 | INHALATION_SPRAY | Freq: Four times a day (QID) | NASAL | 12 refills | Status: DC

## 2017-09-05 MED ORDER — CETIRIZINE HCL 10 MG PO TABS
10.0000 mg | ORAL_TABLET | Freq: Every day | ORAL | 0 refills | Status: DC
Start: 2017-09-05 — End: 2018-07-03

## 2017-09-05 MED FILL — OLMESARTAN-HCTZ 20-12.5 MG: 20-12.5 | 90 days supply | Qty: 90 | Fill #0

## 2017-09-05 MED FILL — NASALCROM 5.2 MG/ACT AERS: 5.2 | 30 days supply | Qty: 26 | Fill #0

## 2017-09-05 MED FILL — CETIRIZINE HCL 10 MG TABS: 10 | 30 days supply | Qty: 30 | Fill #0

## 2017-09-05 NOTE — ED Provider Notes (Signed)
[place]    CSN: [id number] Arrival date & time: 09/05/17  1140     History   Chief Complaint Chief Complaint  Patient presents with  . Sore Throat  . URI    HPI Donna Crawford is a 57 y.o. female.   Caitriona presents with sore throat congestion, sneezing, headache and mild body aches and fatigue which started yesterday. Productive cough. No known fevers. 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 Denies gi/gu complaints. Without skin rash. Denies previous similar. Hx htn.     ROS per HPI.       Past Medical History:  Diagnosis Date  . H/O varicella   . History of measles, mumps, or rubella   . Hypertension   . Non-atypical endometrial cells on cervical Pap smear 2008    There are no active problems to display for this patient.   Past Surgical History:  Procedure Laterality Date  . CHOLECYSTECTOMY    . COLONOSCOPY N/A 01/26/2013   Procedure: COLONOSCOPY;  Surgeon: Beryle Beams, MD;  Location: [facility];  Service: Endoscopy;  Laterality: N/A;  . gall bladder removed  2006    OB History    No data available       Home Medications    Prior to Admission medications   Medication Sig Start Date End Date Taking? Authorizing Provider  cetirizine (ZYRTEC) 10 MG tablet Take 1 tablet (10 mg total) by mouth daily. 09/05/17   Zigmund Gottron, NP  Cholecalciferol (VITAMIN D PO) Take by mouth.    [provider]  cromolyn (NASALCROM) 5.2 MG/ACT nasal spray Place 1 spray into both nostrils 4 (four) times daily. 09/05/17   Zigmund Gottron, NP  Krill Oil 500 MG CAPS Take 600 mg by mouth daily.    [provider]  Melatonin 3 MG TABS Take by mouth.    [provider]  olmesartan-hydrochlorothiazide (BENICAR HCT) 20-12.5 MG per tablet Take 1 tablet by mouth daily.    [provider]    Family History Family History  Problem Relation Age of Onset  . Diabetes Mother    . Hypertension Mother   . Cancer Father   . Heart disease Father   . Hypertension Father   . Stroke Father   . Hypertension Sister   . Cancer Sister   . Diabetes Brother   . Hypertension Brother   . Diabetes Sister   . Hypertension Sister     Social History Social History   Tobacco Use  . Smoking status: Never Smoker  . Smokeless tobacco: Never Used  Substance Use Topics  . Alcohol use: Yes    Comment: Occasional wine  . Drug use: No     Allergies   Patient has no known allergies.   Review of Systems Review of Systems   Physical Exam Triage Vital Signs ED Triage Vitals [09/05/17 1245]  Enc Vitals Group     BP (!) 148/102     Pulse Rate 89     Resp 18     Temp 98.9 F (37.2 C)     Temp Source Oral     SpO2 100 %     Weight      Height      Head Circumference      Peak Flow      Pain Score      Pain Loc      Pain Edu?  Excl. in [place]?    No data found.  Updated Vital Signs BP (!) 148/102 (BP Location: Left Arm)   Pulse 89   Temp 98.9 F (37.2 C) (Oral)   Resp 18   LMP 12/03/2012   SpO2 100%   Visual Acuity Right Eye Distance:   Left Eye Distance:   Bilateral Distance:    Right Eye Near:   Left Eye Near:    Bilateral Near:     Physical Exam  Constitutional: She is oriented to person, place, and time. She appears well-developed and well-nourished. No distress.  HENT:  Head: Normocephalic and atraumatic.  Right Ear: Tympanic membrane, external ear and ear canal normal.  Left Ear: Tympanic membrane, external ear and ear canal normal.  Nose: Nose normal.  Mouth/Throat: Uvula is midline, oropharynx is clear and moist and mucous membranes are normal. No tonsillar exudate.  Limited throat exam due to tongue obstruction and patient unable to comply with use of tongue blade for further evaluation; area visualized appeared WNl without swelling or ulcerations   Eyes: Conjunctivae and EOM are normal. Pupils are equal, round, and reactive to  light.  Cardiovascular: Normal rate, regular rhythm and normal heart sounds.  Pulmonary/Chest: Effort normal and breath sounds normal.  Lymphadenopathy:    She has no cervical adenopathy.  Neurological: She is alert and oriented to person, place, and time.  Skin: Skin is warm and dry.     UC Treatments / Results  Labs (all labs ordered are listed, but only abnormal results are displayed) Labs Reviewed  CULTURE, GROUP A STREP [facility])  POCT RAPID STREP A    EKG  EKG Interpretation None       Radiology No results found.  Procedures Procedures (including critical care time)  Medications Ordered in UC Medications - No data to display   Initial Impression / Assessment and Plan / UC Course  I have reviewed the triage vital signs and the nursing notes.  Pertinent labs & imaging results that were available during my care of the patient were reviewed by me and considered in my medical decision making (see chart for details).     Non toxic in appearance. Afebrile. Without tachypnea, tachycardia or hypoxia. History and physical consistent with viral illness vs allergic in nature. Supportive cares recommended. If symptoms worsen or do not improve in the next week to return to be seen or to follow up with PCP.  Patient verbalized understanding and agreeable to plan.    Final Clinical Impressions(s) / UC Diagnoses   Final diagnoses:  Viral URI with cough    ED Discharge Orders        Ordered    cromolyn (NASALCROM) 5.2 MG/ACT nasal spray  4 times daily     09/05/17 1337    cetirizine (ZYRTEC) 10 MG tablet  Daily     09/05/17 1337       Controlled Substance Prescriptions [place] Controlled Substance Registry consulted? Not Applicable   Zigmund Gottron, NP 09/05/17 1342

## 2017-09-05 NOTE — ED Triage Notes (Signed)
Pt here for sore throat and URI sx x 2 days

## 2017-09-05 NOTE — Discharge Instructions (Signed)
Push fluids to ensure adequate hydration and keep secretions thin.  Tylenol and/or ibuprofen as needed for pain or fevers.  Nasal spray up to 4 times a day to help with congestion and sore throat. Zyrtec daily to help with congestion. If symptoms worsen or do not improve in the next week to return to be seen or to follow up with your PCP.

## 2017-09-07 LAB — CULTURE, GROUP A STREP (THRC)

## 2017-09-08 ENCOUNTER — Telehealth: Payer: 59 | Admitting: Family

## 2017-09-08 DIAGNOSIS — J028 Acute pharyngitis due to other specified organisms: Secondary | ICD-10-CM

## 2017-09-08 DIAGNOSIS — B9689 Other specified bacterial agents as the cause of diseases classified elsewhere: Secondary | ICD-10-CM

## 2017-09-08 MED ORDER — AZITHROMYCIN 250 MG PO TABS: ORAL_TABLET | ORAL | 0 refills | Status: DC

## 2017-09-08 MED ORDER — BENZONATATE 100 MG PO CAPS: 100.0000 mg | ORAL_CAPSULE | Freq: Three times a day (TID) | ORAL | 0 refills | Status: DC | PRN

## 2017-09-08 MED ORDER — PREDNISONE 5 MG PO TABS: 5.0000 mg | ORAL_TABLET | ORAL | 0 refills | Status: DC

## 2017-09-08 MED FILL — BENZONATATE 100 MG CAP: 100 | 5 days supply | Qty: 30 | Fill #0

## 2017-09-08 MED FILL — predniSONE 5 MG (21) TBPK: 5 | 6 days supply | Qty: 21 | Fill #0

## 2017-09-08 MED FILL — AZITHROMYCIN 250 MG TABS: 250 | 5 days supply | Qty: 6 | Fill #0

## 2017-09-08 NOTE — Progress Notes (Signed)

## 2017-09-23 DIAGNOSIS — I129 Hypertensive chronic kidney disease with stage 1 through stage 4 chronic kidney disease, or unspecified chronic kidney disease: Secondary | ICD-10-CM | POA: Diagnosis not present

## 2017-09-23 DIAGNOSIS — N182 Chronic kidney disease, stage 2 (mild): Secondary | ICD-10-CM | POA: Diagnosis not present

## 2017-09-23 DIAGNOSIS — Z6841 Body Mass Index (BMI) 40.0 and over, adult: Secondary | ICD-10-CM | POA: Diagnosis not present

## 2017-11-17 ENCOUNTER — Ambulatory Visit: Payer: 59 | Admitting: Skilled Nursing Facility1

## 2017-12-02 MED FILL — OLMESARTAN-HCTZ 20-12.5 MG: 20-12.5 | 30 days supply | Qty: 30 | Fill #0

## 2018-01-05 MED FILL — OLMESARTAN-HCTZ 20-12.5 MG: 20-12.5 | 30 days supply | Qty: 30 | Fill #1

## 2018-01-10 DIAGNOSIS — H40053 Ocular hypertension, bilateral: Secondary | ICD-10-CM | POA: Diagnosis not present

## 2018-01-10 DIAGNOSIS — H40013 Open angle with borderline findings, low risk, bilateral: Secondary | ICD-10-CM | POA: Diagnosis not present

## 2018-01-10 DIAGNOSIS — Z83511 Family history of glaucoma: Secondary | ICD-10-CM | POA: Diagnosis not present

## 2018-02-06 MED FILL — OLMESARTAN-HCTZ 20-12.5 MG: 20-12.5 | 30 days supply | Qty: 30 | Fill #2

## 2018-03-06 MED FILL — OLMESARTAN-HCTZ 20-12.5 MG: 20-12.5 | 30 days supply | Qty: 30 | Fill #3

## 2018-03-08 MED FILL — VITAMIN D3 2000 UNIT CAPS: 50 MCG | 30 days supply | Qty: 60 | Fill #0

## 2018-03-09 MED FILL — OLMESARTAN-HCTZ 20-12.5 MG: 20-12.5 | 30 days supply | Qty: 30 | Fill #4

## 2018-03-11 ENCOUNTER — Encounter: Payer: Self-pay | Admitting: Nurse Practitioner

## 2018-03-31 ENCOUNTER — Encounter: Payer: 59 | Admitting: Nurse Practitioner

## 2018-04-05 ENCOUNTER — Encounter: Payer: 59 | Admitting: Nurse Practitioner

## 2018-05-15 MED FILL — OLMESARTAN-HCTZ 20-12.5 MG: 20-12.5 | 30 days supply | Qty: 30 | Fill #5

## 2018-05-26 ENCOUNTER — Emergency Department (HOSPITAL_COMMUNITY)
Admission: EM | Admit: 2018-05-26 | Discharge: 2018-05-26 | Disposition: A | Discharge status: Home / Self Care | Payer: 59 | Attending: Emergency Medicine | Admitting: Emergency Medicine

## 2018-05-26 ENCOUNTER — Other Ambulatory Visit: Payer: Self-pay

## 2018-05-26 ENCOUNTER — Encounter (HOSPITAL_COMMUNITY): Payer: Self-pay

## 2018-05-26 ENCOUNTER — Emergency Department (HOSPITAL_COMMUNITY): Payer: 59

## 2018-05-26 DIAGNOSIS — R079 Chest pain, unspecified: Secondary | ICD-10-CM

## 2018-05-26 DIAGNOSIS — I10 Essential (primary) hypertension: Secondary | ICD-10-CM | POA: Diagnosis not present

## 2018-05-26 DIAGNOSIS — Z79899 Other long term (current) drug therapy: Secondary | ICD-10-CM | POA: Diagnosis not present

## 2018-05-26 DIAGNOSIS — R0789 Other chest pain: Secondary | ICD-10-CM | POA: Diagnosis not present

## 2018-05-26 LAB — CBC
HCT: 40.5 % (ref 36.0–46.0)
Hemoglobin: 12.5 g/dL (ref 12.0–15.0)
MCH: 27.2 pg (ref 26.0–34.0)
MCHC: 30.9 g/dL (ref 30.0–36.0)
MCV: 88 fL (ref 80.0–100.0)
Platelets: 290 10*3/uL (ref 150–400)
RBC: 4.6 MIL/uL (ref 3.87–5.11)
RDW: 13.7 % (ref 11.5–15.5)
WBC: 5 10*3/uL (ref 4.0–10.5)
nRBC: 0 % (ref 0.0–0.2)

## 2018-05-26 LAB — BASIC METABOLIC PANEL
Anion gap: 9 (ref 5–15)
BUN: 13 mg/dL (ref 6–20)
CO2: 28 mmol/L (ref 22–32)
Calcium: 9.3 mg/dL (ref 8.9–10.3)
Chloride: 105 mmol/L (ref 98–111)
Creatinine, Ser: 0.95 mg/dL (ref 0.44–1.00)
GFR calc Af Amer: >60 mL/min (ref >60)
GFR calc non Af Amer: >60 mL/min (ref >60)
Glucose, Bld: 94 mg/dL (ref 70–99)
Potassium: 3.5 mmol/L (ref 3.5–5.1)
Sodium: 142 mmol/L (ref 135–145)

## 2018-05-26 LAB — I-STAT TROPONIN, ED: TROPONIN I, POC: 0 ng/mL (ref 0.00–0.08)

## 2018-05-26 LAB — D-DIMER, QUANTITATIVE: D-Dimer, Quant: 0.37 ug/mL-FEU (ref 0.00–0.50)

## 2018-05-26 MED ORDER — METHOCARBAMOL 500 MG PO TABS: 500.0000 mg | ORAL_TABLET | Freq: Two times a day (BID) | ORAL | 0 refills | Status: DC

## 2018-05-26 MED ORDER — KETOROLAC TROMETHAMINE 30 MG/ML IJ SOLN
30.0000 mg | Freq: Once | INTRAMUSCULAR | Status: AC
  Administered 2018-05-26: 30 mg via INTRAVENOUS
  Filled 2018-05-26: qty 1

## 2018-05-26 MED FILL — METHOCARBAMOL 500 MG TABLET: 500 | 10 days supply | Qty: 20 | Fill #0

## 2018-05-26 NOTE — ED Provider Notes (Signed)
[facility] DEPT Provider Note   CSN: [id number] Arrival date & time: 05/26/18  3154     History   Chief Complaint Chief Complaint  Patient presents with  . Chest Pain    HPI Donna Crawford is a 57 y.o. female.  She has a history of hypertension and a family history of coronary disease.  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  She said starting yesterday evening she is noticing some sharp right upper chest pain that has been intermittent in nature and appears to be provoked by positions that she is in.  It is sometimes happening when she takes a deep breath.  Also can occur with movement of her shoulder and arm.  She cannot think of any trauma that she had.  It is not associated with any diaphoresis or shortness of breath lightheadedness dizziness nausea or vomiting.  No fevers or chills or cough.  No known history of coronary disease.  The history is provided by the patient.  Chest Pain   This is a new problem. The current episode started yesterday. The problem has not changed since onset.The pain is associated with movement, raising an arm and breathing. The pain is present in the lateral region. The pain is moderate. The quality of the pain is described as sharp. The pain does not radiate. Pertinent negatives include no abdominal pain, no cough, no diaphoresis, no dizziness, no fever, no leg pain, no nausea, no numbness, no shortness of breath, no sputum production, no vomiting and no weakness. She has tried nothing for the symptoms. The treatment provided no relief. Risk factors include obesity.  Her family medical history is significant for CAD and PE.    Past Medical History:  Diagnosis Date  . H/O varicella   . History of measles, mumps, or rubella   . Hypertension   . Non-atypical endometrial cells on cervical Pap smear 2008    There are no active problems to display for this patient.   Past Surgical History:  Procedure Laterality  Date  . CHOLECYSTECTOMY    . COLONOSCOPY N/A 01/26/2013   Procedure: COLONOSCOPY;  Surgeon: Beryle Beams, MD;  Location: [facility];  Service: Endoscopy;  Laterality: N/A;  . gall bladder removed  2006     OB History   None      Home Medications    Prior to Admission medications   Medication Sig Start Date End Date Taking? Authorizing Provider  azithromycin (ZITHROMAX) 250 MG tablet Take 2 tabs now then 1 daily times 4 days 09/08/17   Withrow, Elyse Jarvis, FNP  benzonatate (TESSALON PERLES) 100 MG capsule Take 1-2 capsules (100-200 mg total) by mouth every 8 (eight) hours as needed for cough. 09/08/17   Withrow, Elyse Jarvis, FNP  cetirizine (ZYRTEC) 10 MG tablet Take 1 tablet (10 mg total) by mouth daily. 09/05/17   Zigmund Gottron, NP  Cholecalciferol (VITAMIN D PO) Take by mouth.    [provider]  Cholecalciferol (VITAMIN D) 2000 units tablet Take 2,000 Units by mouth 2 (two) times daily.    [provider]  cromolyn (NASALCROM) 5.2 MG/ACT nasal spray Place 1 spray into both nostrils 4 (four) times daily. 09/05/17   Zigmund Gottron, NP  glucosamine-chondroitin 500-400 MG tablet Take 1 tablet by mouth 3 (three) times daily.    [provider]  Javier Docker Oil 500 MG CAPS Take 600 mg by mouth daily.    [provider]  Melatonin 3 MG TABS Take by mouth.    [provider]  olmesartan-hydrochlorothiazide (BENICAR HCT) 20-12.5 MG per tablet Take 1 tablet by mouth daily.    [provider]  predniSONE (DELTASONE) 5 MG tablet Take 1 tablet (5 mg total) by mouth as directed. Taper 6,5,4,3,2,1 09/08/17   Withrow, Elyse Jarvis, FNP    Family History Family History  Problem Relation Age of Onset  . Diabetes Mother   . Hypertension Mother   . Cancer Father   . Heart disease Father   . Hypertension Father   . Stroke Father   . Hypertension Sister   . Cancer Sister   . Diabetes Brother   . Hypertension Brother   . Diabetes Sister   . Hypertension  Sister     Social History Social History   Tobacco Use  . Smoking status: Never Smoker  . Smokeless tobacco: Never Used  Substance Use Topics  . Alcohol use: Yes    Comment: Occasional wine  . Drug use: No     Allergies   Patient has no known allergies.   Review of Systems Review of Systems  Constitutional: Negative for diaphoresis and fever.  HENT: Negative for sore throat.   Eyes: Negative for visual disturbance.  Respiratory: Negative for cough, sputum production and shortness of breath.   Cardiovascular: Positive for chest pain.  Gastrointestinal: Negative for abdominal pain, nausea and vomiting.  Genitourinary: Negative for dysuria.  Musculoskeletal: Negative for neck pain.  Skin: Negative for rash.  Neurological: Negative for dizziness, weakness and numbness.     Physical Exam Updated Vital Signs BP (!) 147/96 (BP Location: Right Arm)   Pulse 88   Resp 14   Ht 5\' 4"  (1.626 m)   Wt (!) 145.2 kg   LMP 12/03/2012   SpO2 98%   BMI 54.93 kg/m   Physical Exam  Constitutional: She appears well-developed and well-nourished. No distress.  HENT:  Head: Normocephalic and atraumatic.  Eyes: Conjunctivae are normal.  Neck: Neck supple.  Cardiovascular: Normal rate, regular rhythm and normal pulses.  No murmur heard. Pulmonary/Chest: Effort normal and breath sounds normal. No respiratory distress.  Abdominal: Soft. There is no tenderness.  Musculoskeletal: She exhibits no tenderness or deformity.       Right lower leg: She exhibits no tenderness.       Left lower leg: She exhibits no tenderness.  Neurological: She is alert. She has normal strength. No sensory deficit. Gait normal. GCS eye subscore is 4. GCS verbal subscore is 5. GCS motor subscore is 6.  Skin: Skin is warm and dry.  Psychiatric: She has a normal mood and affect.  Nursing note and vitals reviewed.    ED Treatments / Results  Labs (all labs ordered are listed, but only abnormal results are  displayed) Labs Reviewed  BASIC METABOLIC PANEL  CBC  D-DIMER, QUANTITATIVE (NOT AT [facility])  I-STAT TROPONIN, ED    EKG EKG Interpretation  Date/Time:  Friday May 26 2018 09:23:02 EST Ventricular Rate:  82 PR Interval:    QRS Duration: 96 QT Interval:  386 QTC Calculation: 451 R Axis:   39 Text Interpretation:  Sinus rhythm Probable left atrial enlargement Low voltage, precordial leads Borderline T abnormalities, anterior leads Baseline wander in lead(s) II III aVF V1 V4 V5 V6 similar pattern to prior 1/06 Confirmed by Aletta Edouard [phone]) on 05/26/2018 9:27:46 AM   Radiology Dg Chest 2 View  Result Date: 05/26/2018 CLINICAL DATA:  56 year old female with a  history of chest pain EXAM: CHEST - 2 VIEW COMPARISON:  None. FINDINGS: Cardiomediastinal silhouette unchanged in size and contour. No evidence of central vascular congestion. No pneumothorax or pleural effusion. No confluent airspace disease. Degenerative changes of the spine. No acute displaced fracture IMPRESSION: Negative for acute cardiopulmonary disease Electronically Signed   By: Corrie Mckusick D.O.   On: 05/26/2018 09:51    Procedures Procedures (including critical care time)  Medications Ordered in ED Medications - No data to display   Initial Impression / Assessment and Plan / ED Course  I have reviewed the triage vital signs and the nursing notes.  Pertinent labs & imaging results that were available during my care of the patient were reviewed by me and considered in my medical decision making (see chart for details).  Clinical Course as of May 26 1526  Fri May 26, 2018  1054 Patient's lab work including troponin and d-dimer is been unremarkable.  Chest x-ray and EKG also unremarkable.  Will review with patient but likely discharge and have her treat this as musculoskeletal.   [MB]    Clinical Course User Index [MB] Hayden Rasmussen, MD     Final Clinical Impressions(s) / ED Diagnoses   Final  diagnoses:  Nonspecific chest pain    ED Discharge Orders    None       Hayden Rasmussen, MD 05/26/18 1527

## 2018-05-26 NOTE — Discharge Instructions (Addendum)
You were evaluated in the emergency department for some sharp intermittent right-sided chest pain.  You had blood work including a troponin and d-dimer along with a chest x-ray and EKG that did not show an obvious cause of your symptoms.  It is likely this is muscular and you can treat with anti-inflammatories, gentle stretching, and ice or heat.  Please follow-up with your doctor and return if any worsening symptoms.

## 2018-05-26 NOTE — ED Triage Notes (Addendum)
Pt states that last night she was having some sharp pain with movement in her right shoulder. Pt stated it continued to this morning and got worse. Pt states that if she moves a certain way, she becomes SHOB. Pt states that she was moving heavy items yesterday.

## 2018-06-05 ENCOUNTER — Encounter: Payer: 59 | Admitting: Nurse Practitioner

## 2018-06-16 ENCOUNTER — Other Ambulatory Visit: Payer: Self-pay | Admitting: Internal Medicine

## 2018-06-19 MED FILL — OLMESARTAN-HCTZ 20-12.5 MG: 20-12.5 | 30 days supply | Qty: 30 | Fill #0

## 2018-07-03 ENCOUNTER — Ambulatory Visit: Payer: 59 | Admitting: Nurse Practitioner

## 2018-07-03 ENCOUNTER — Encounter: Payer: Self-pay | Admitting: Nurse Practitioner

## 2018-07-03 VITALS — BP 124/72 | HR 64 | Temp 98.4°F | Ht 65.2 in | Wt 316.4 lb

## 2018-07-03 DIAGNOSIS — Z0000 Encounter for general adult medical examination without abnormal findings: Secondary | ICD-10-CM | POA: Diagnosis not present

## 2018-07-03 DIAGNOSIS — Z1231 Encounter for screening mammogram for malignant neoplasm of breast: Secondary | ICD-10-CM

## 2018-07-03 DIAGNOSIS — Z6841 Body Mass Index (BMI) 40.0 and over, adult: Secondary | ICD-10-CM | POA: Diagnosis not present

## 2018-07-03 DIAGNOSIS — R252 Cramp and spasm: Secondary | ICD-10-CM | POA: Diagnosis not present

## 2018-07-03 DIAGNOSIS — I10 Essential (primary) hypertension: Secondary | ICD-10-CM | POA: Diagnosis not present

## 2018-07-03 LAB — POCT UA - MICROALBUMIN
Albumin/Creatinine Ratio, Urine, POC: <30
Creatinine, POC: 300 mg/dL
Microalbumin Ur, POC: 30 mg/L

## 2018-07-03 LAB — POCT URINALYSIS DIPSTICK
Bilirubin, UA: NEGATIVE
Blood, UA: NEGATIVE
Glucose, UA: NEGATIVE
KETONES UA: NEGATIVE
Leukocytes, UA: NEGATIVE
Nitrite, UA: NEGATIVE
Protein, UA: NEGATIVE
Spec Grav, UA: 1.025 (ref 1.010–1.025)
Urobilinogen, UA: 0.2 E.U./dL
pH, UA: 5.5 (ref 5.0–8.0)

## 2018-07-03 MED ORDER — NALTREXONE-BUPROPION HCL ER 8-90 MG PO TB12: 2.0000 | ORAL_TABLET | Freq: Two times a day (BID) | ORAL | 1 refills | Status: DC

## 2018-07-03 MED ORDER — CYCLOBENZAPRINE HCL 10 MG PO TABS: 10.0000 mg | ORAL_TABLET | Freq: Three times a day (TID) | ORAL | 0 refills | Status: DC | PRN

## 2018-07-03 MED FILL — CYCLOBENZAPRINE HCL 10 MG T: 10 | 10 days supply | Qty: 30 | Fill #0

## 2018-07-03 NOTE — Patient Instructions (Addendum)
Health Maintenance, Female Adopting a healthy lifestyle and getting preventive care can go a long way to promote health and wellness. Talk with your health care provider about what schedule of regular examinations is right for you. This is a good chance for you to check in with your provider about disease prevention and staying healthy. In between checkups, there are plenty of things you can do on your own. Experts have done a lot of research about which lifestyle changes and preventive measures are most likely to keep you healthy. Ask your health care provider for more information. Weight and diet Eat a healthy diet  Be sure to include plenty of vegetables, fruits, low-fat dairy products, and lean protein.  Do not eat a lot of foods high in solid fats, added sugars, or salt.  Get regular exercise. This is one of the most important things you can do for your health. ? Most adults should exercise for at least 150 minutes each week. The exercise should increase your heart rate and make you sweat (moderate-intensity exercise). ? Most adults should also do strengthening exercises at least twice a week. This is in addition to the moderate-intensity exercise. Maintain a healthy weight  Body mass index (BMI) is a measurement that can be used to identify possible weight problems. It estimates body fat based on height and weight. Your health care provider can help determine your BMI and help you achieve or maintain a healthy weight.  For females 20 years of age and older: ? A BMI below 18.5 is considered underweight. ? A BMI of 18.5 to 24.9 is normal. ? A BMI of 25 to 29.9 is considered overweight. ? A BMI of 30 and above is considered obese. Watch levels of cholesterol and blood lipids  You should start having your blood tested for lipids and cholesterol at 56 years of age, then have this test every 5 years.  You may need to have your cholesterol levels checked more often if: ? Your lipid or  cholesterol levels are high. ? You are older than 56 years of age. ? You are at high risk for heart disease. Cancer screening Lung Cancer  Lung cancer screening is recommended for adults 55-80 years old who are at high risk for lung cancer because of a history of smoking.  A yearly low-dose CT scan of the lungs is recommended for people who: ? Currently smoke. ? Have quit within the past 15 years. ? Have at least a 30-pack-year history of smoking. A pack year is smoking an average of one pack of cigarettes a day for 1 year.  Yearly screening should continue until it has been 15 years since you quit.  Yearly screening should stop if you develop a health problem that would prevent you from having lung cancer treatment. Breast Cancer  Practice breast self-awareness. This means understanding how your breasts normally appear and feel.  It also means doing regular breast self-exams. Let your health care provider know about any changes, no matter how small.  If you are in your 20s or 30s, you should have a clinical breast exam (CBE) by a health care provider every 1-3 years as part of a regular health exam.  If you are 40 or older, have a CBE every year. Also consider having a breast X-ray (mammogram) every year.  If you have a family history of breast cancer, talk to your health care provider about genetic screening.  If you are at high risk for breast cancer, talk   to your health care provider about having an MRI and a mammogram every year.  Breast cancer gene (BRCA) assessment is recommended for women who have family members with BRCA-related cancers. BRCA-related cancers include: ? Breast. ? Ovarian. ? Tubal. ? Peritoneal cancers.  Results of the assessment will determine the need for genetic counseling and BRCA1 and BRCA2 testing. Cervical Cancer Your health care provider may recommend that you be screened regularly for cancer of the pelvic organs (ovaries, uterus, and vagina).  This screening involves a pelvic examination, including checking for microscopic changes to the surface of your cervix (Pap test). You may be encouraged to have this screening done every 3 years, beginning at age 21.  For women ages 30-65, health care providers may recommend pelvic exams and Pap testing every 3 years, or they may recommend the Pap and pelvic exam, combined with testing for human papilloma virus (HPV), every 5 years. Some types of HPV increase your risk of cervical cancer. Testing for HPV may also be done on women of any age with unclear Pap test results.  Other health care providers may not recommend any screening for nonpregnant women who are considered low risk for pelvic cancer and who do not have symptoms. Ask your health care provider if a screening pelvic exam is right for you.  If you have had past treatment for cervical cancer or a condition that could lead to cancer, you need Pap tests and screening for cancer for at least 20 years after your treatment. If Pap tests have been discontinued, your risk factors (such as having a new sexual partner) need to be reassessed to determine if screening should resume. Some women have medical problems that increase the chance of getting cervical cancer. In these cases, your health care provider may recommend more frequent screening and Pap tests. Colorectal Cancer  This type of cancer can be detected and often prevented.  Routine colorectal cancer screening usually begins at 56 years of age and continues through 56 years of age.  Your health care provider may recommend screening at an earlier age if you have risk factors for colon cancer.  Your health care provider may also recommend using home test kits to check for hidden blood in the stool.  A small camera at the end of a tube can be used to examine your colon directly (sigmoidoscopy or colonoscopy). This is done to check for the earliest forms of colorectal cancer.  Routine  screening usually begins at age 50.  Direct examination of the colon should be repeated every 5-10 years through 56 years of age. However, you may need to be screened more often if early forms of precancerous polyps or small growths are found. Skin Cancer  Check your skin from head to toe regularly.  Tell your health care provider about any new moles or changes in moles, especially if there is a change in a mole's shape or color.  Also tell your health care provider if you have a mole that is larger than the size of a pencil eraser.  Always use sunscreen. Apply sunscreen liberally and repeatedly throughout the day.  Protect yourself by wearing long sleeves, pants, a wide-brimmed hat, and sunglasses whenever you are outside. Heart disease, diabetes, and high blood pressure  High blood pressure causes heart disease and increases the risk of stroke. High blood pressure is more likely to develop in: ? People who have blood pressure in the high end of the normal range (130-139/85-89 mm Hg). ? People   who are overweight or obese. ? People who are African American.  If you are 84-22 years of age, have your blood pressure checked every 3-5 years. If you are 67 years of age or older, have your blood pressure checked every year. You should have your blood pressure measured twice-once when you are at a hospital or clinic, and once when you are not at a hospital or clinic. Record the average of the two measurements. To check your blood pressure when you are not at a hospital or clinic, you can use: ? An automated blood pressure machine at a pharmacy. ? A home blood pressure monitor.  If you are between 52 years and 3 years old, ask your health care provider if you should take aspirin to prevent strokes.  Have regular diabetes screenings. This involves taking a blood sample to check your fasting blood sugar level. ? If you are at a normal weight and have a low risk for diabetes, have this test once  every three years after 56 years of age. ? If you are overweight and have a high risk for diabetes, consider being tested at a younger age or more often. Preventing infection Hepatitis B  If you have a higher risk for hepatitis B, you should be screened for this virus. You are considered at high risk for hepatitis B if: ? You were born in a country where hepatitis B is common. Ask your health care provider which countries are considered high risk. ? Your parents were born in a high-risk country, and you have not been immunized against hepatitis B (hepatitis B vaccine). ? You have HIV or AIDS. ? You use needles to inject street drugs. ? You live with someone who has hepatitis B. ? You have had sex with someone who has hepatitis B. ? You get hemodialysis treatment. ? You take certain medicines for conditions, including cancer, organ transplantation, and autoimmune conditions. Hepatitis C  Blood testing is recommended for: ? Everyone born from [birth year] through 1963. ? Anyone with known risk factors for hepatitis C. Sexually transmitted infections (STIs)  You should be screened for sexually transmitted infections (STIs) including gonorrhea and chlamydia if: ? You are sexually active and are younger than 56 years of age. ? You are older than 55 years of age and your health care provider tells you that you are at risk for this type of infection. ? Your sexual activity has changed since you were last screened and you are at an increased risk for chlamydia or gonorrhea. Ask your health care provider if you are at risk.  If you do not have HIV, but are at risk, it may be recommended that you take a prescription medicine daily to prevent HIV infection. This is called pre-exposure prophylaxis (PrEP). You are considered at risk if: ? You are sexually active and do not regularly use condoms or know the HIV status of your partner(s). ? You take drugs by injection. ? You are sexually active with a partner  who has HIV. Talk with your health care provider about whether you are at high risk of being infected with HIV. If you choose to begin PrEP, you should first be tested for HIV. You should then be tested every 3 months for as long as you are taking PrEP. Pregnancy  If you are premenopausal and you may become pregnant, ask your health care provider about preconception counseling.  If you may become pregnant, take 400 to 800 micrograms (mcg) of folic acid every  day.  If you want to prevent pregnancy, talk to your health care provider about birth control (contraception). Osteoporosis and menopause  Osteoporosis is a disease in which the bones lose minerals and strength with aging. This can result in serious bone fractures. Your risk for osteoporosis can be identified using a bone density scan.  If you are 68 years of age or older, or if you are at risk for osteoporosis and fractures, ask your health care provider if you should be screened.  Ask your health care provider whether you should take a calcium or vitamin D supplement to lower your risk for osteoporosis.  Menopause may have certain physical symptoms and risks.  Hormone replacement therapy may reduce some of these symptoms and risks. Talk to your health care provider about whether hormone replacement therapy is right for you. Follow these instructions at home:  Schedule regular health, dental, and eye exams.  Stay current with your immunizations.  Do not use any tobacco products including cigarettes, chewing tobacco, or electronic cigarettes.  If you are pregnant, do not drink alcohol.  If you are breastfeeding, limit how much and how often you drink alcohol.  Limit alcohol intake to no more than 1 drink per day for nonpregnant women. One drink equals 12 ounces of beer, 5 ounces of wine, or 1 ounces of hard liquor.  Do not use street drugs.  Do not share needles.  Ask your health care provider for help if you need support  or information about quitting drugs.  Tell your health care provider if you often feel depressed.  Tell your health care provider if you have ever been abused or do not feel safe at home. This information is not intended to replace advice given to you by your health care provider. Make sure you discuss any questions you have with your health care provider. Document Released: 12/21/2010 Document Revised: 11/13/2015 Document Reviewed: 03/11/2015 Elsevier Interactive Patient Education  2019 [place] Maintenance, Female Adopting a healthy lifestyle and getting preventive care can go a long way to promote health and wellness. Talk with your health care provider about what schedule of regular examinations is right for you. This is a good chance for you to check in with your provider about disease prevention and staying healthy. In between checkups, there are plenty of things you can do on your own. Experts have done a lot of research about which lifestyle changes and preventive measures are most likely to keep you healthy. Ask your health care provider for more information. Weight and diet Eat a healthy diet  Be sure to include plenty of vegetables, fruits, low-fat dairy products, and lean protein.  Do not eat a lot of foods high in solid fats, added sugars, or salt.  Get regular exercise. This is one of the most important things you can do for your health. ? Most adults should exercise for at least 150 minutes each week. The exercise should increase your heart rate and make you sweat (moderate-intensity exercise). ? Most adults should also do strengthening exercises at least twice a week. This is in addition to the moderate-intensity exercise. Maintain a healthy weight  Body mass index (BMI) is a measurement that can be used to identify possible weight problems. It estimates body fat based on height and weight. Your health care provider can help determine your BMI and help you  achieve or maintain a healthy weight.  For females 36 years of age and older: ? A BMI below  18.5 is considered underweight. ? A BMI of 18.5 to 24.9 is normal. ? A BMI of 25 to 29.9 is considered overweight. ? A BMI of 30 and above is considered obese. Watch levels of cholesterol and blood lipids  You should start having your blood tested for lipids and cholesterol at 56 years of age, then have this test every 5 years.  You may need to have your cholesterol levels checked more often if: ? Your lipid or cholesterol levels are high. ? You are older than 56 years of age. ? You are at high risk for heart disease. Cancer screening Lung Cancer  Lung cancer screening is recommended for adults 4-88 years old who are at high risk for lung cancer because of a history of smoking.  A yearly low-dose CT scan of the lungs is recommended for people who: ? Currently smoke. ? Have quit within the past 15 years. ? Have at least a 30-pack-year history of smoking. A pack year is smoking an average of one pack of cigarettes a day for 1 year.  Yearly screening should continue until it has been 15 years since you quit.  Yearly screening should stop if you develop a health problem that would prevent you from having lung cancer treatment. Breast Cancer  Practice breast self-awareness. This means understanding how your breasts normally appear and feel.  It also means doing regular breast self-exams. Let your health care provider know about any changes, no matter how small.  If you are in your 20s or 30s, you should have a clinical breast exam (CBE) by a health care provider every 1-3 years as part of a regular health exam.  If you are 42 or older, have a CBE every year. Also consider having a breast X-ray (mammogram) every year.  If you have a family history of breast cancer, talk to your health care provider about genetic screening.  If you are at high risk for breast cancer, talk to your health care  provider about having an MRI and a mammogram every year.  Breast cancer gene (BRCA) assessment is recommended for women who have family members with BRCA-related cancers. BRCA-related cancers include: ? Breast. ? Ovarian. ? Tubal. ? Peritoneal cancers.  Results of the assessment will determine the need for genetic counseling and BRCA1 and BRCA2 testing. Cervical Cancer Your health care provider may recommend that you be screened regularly for cancer of the pelvic organs (ovaries, uterus, and vagina). This screening involves a pelvic examination, including checking for microscopic changes to the surface of your cervix (Pap test). You may be encouraged to have this screening done every 3 years, beginning at age 35.  For women ages 4-65, health care providers may recommend pelvic exams and Pap testing every 3 years, or they may recommend the Pap and pelvic exam, combined with testing for human papilloma virus (HPV), every 5 years. Some types of HPV increase your risk of cervical cancer. Testing for HPV may also be done on women of any age with unclear Pap test results.  Other health care providers may not recommend any screening for nonpregnant women who are considered low risk for pelvic cancer and who do not have symptoms. Ask your health care provider if a screening pelvic exam is right for you.  If you have had past treatment for cervical cancer or a condition that could lead to cancer, you need Pap tests and screening for cancer for at least 20 years after your treatment. If Pap tests have  been discontinued, your risk factors (such as having a new sexual partner) need to be reassessed to determine if screening should resume. Some women have medical problems that increase the chance of getting cervical cancer. In these cases, your health care provider may recommend more frequent screening and Pap tests. Colorectal Cancer  This type of cancer can be detected and often prevented.  Routine  colorectal cancer screening usually begins at 56 years of age and continues through 56 years of age.  Your health care provider may recommend screening at an earlier age if you have risk factors for colon cancer.  Your health care provider may also recommend using home test kits to check for hidden blood in the stool.  A small camera at the end of a tube can be used to examine your colon directly (sigmoidoscopy or colonoscopy). This is done to check for the earliest forms of colorectal cancer.  Routine screening usually begins at age 66.  Direct examination of the colon should be repeated every 5-10 years through 56 years of age. However, you may need to be screened more often if early forms of precancerous polyps or small growths are found. Skin Cancer  Check your skin from head to toe regularly.  Tell your health care provider about any new moles or changes in moles, especially if there is a change in a mole's shape or color.  Also tell your health care provider if you have a mole that is larger than the size of a pencil eraser.  Always use sunscreen. Apply sunscreen liberally and repeatedly throughout the day.  Protect yourself by wearing long sleeves, pants, a wide-brimmed hat, and sunglasses whenever you are outside. Heart disease, diabetes, and high blood pressure  High blood pressure causes heart disease and increases the risk of stroke. High blood pressure is more likely to develop in: ? People who have blood pressure in the high end of the normal range (130-139/85-89 mm Hg). ? People who are overweight or obese. ? People who are African American.  If you are 21-68 years of age, have your blood pressure checked every 3-5 years. If you are 41 years of age or older, have your blood pressure checked every year. You should have your blood pressure measured twice-once when you are at a hospital or clinic, and once when you are not at a hospital or clinic. Record the average of the two  measurements. To check your blood pressure when you are not at a hospital or clinic, you can use: ? An automated blood pressure machine at a pharmacy. ? A home blood pressure monitor.  If you are between 63 years and 75 years old, ask your health care provider if you should take aspirin to prevent strokes.  Have regular diabetes screenings. This involves taking a blood sample to check your fasting blood sugar level. ? If you are at a normal weight and have a low risk for diabetes, have this test once every three years after 56 years of age. ? If you are overweight and have a high risk for diabetes, consider being tested at a younger age or more often. Preventing infection Hepatitis B  If you have a higher risk for hepatitis B, you should be screened for this virus. You are considered at high risk for hepatitis B if: ? You were born in a country where hepatitis B is common. Ask your health care provider which countries are considered high risk. ? Your parents were born in a high-risk  country, and you have not been immunized against hepatitis B (hepatitis B vaccine). ? You have HIV or AIDS. ? You use needles to inject street drugs. ? You live with someone who has hepatitis B. ? You have had sex with someone who has hepatitis B. ? You get hemodialysis treatment. ? You take certain medicines for conditions, including cancer, organ transplantation, and autoimmune conditions. Hepatitis C  Blood testing is recommended for: ? Everyone born from [birth year] through 1963. ? Anyone with known risk factors for hepatitis C. Sexually transmitted infections (STIs)  You should be screened for sexually transmitted infections (STIs) including gonorrhea and chlamydia if: ? You are sexually active and are younger than 56 years of age. ? You are older than 56 years of age and your health care provider tells you that you are at risk for this type of infection. ? Your sexual activity has changed since you were last  screened and you are at an increased risk for chlamydia or gonorrhea. Ask your health care provider if you are at risk.  If you do not have HIV, but are at risk, it may be recommended that you take a prescription medicine daily to prevent HIV infection. This is called pre-exposure prophylaxis (PrEP). You are considered at risk if: ? You are sexually active and do not regularly use condoms or know the HIV status of your partner(s). ? You take drugs by injection. ? You are sexually active with a partner who has HIV. Talk with your health care provider about whether you are at high risk of being infected with HIV. If you choose to begin PrEP, you should first be tested for HIV. You should then be tested every 3 months for as long as you are taking PrEP. Pregnancy  If you are premenopausal and you may become pregnant, ask your health care provider about preconception counseling.  If you may become pregnant, take 400 to 800 micrograms (mcg) of folic acid every day.  If you want to prevent pregnancy, talk to your health care provider about birth control (contraception). Osteoporosis and menopause  Osteoporosis is a disease in which the bones lose minerals and strength with aging. This can result in serious bone fractures. Your risk for osteoporosis can be identified using a bone density scan.  If you are 61 years of age or older, or if you are at risk for osteoporosis and fractures, ask your health care provider if you should be screened.  Ask your health care provider whether you should take a calcium or vitamin D supplement to lower your risk for osteoporosis.  Menopause may have certain physical symptoms and risks.  Hormone replacement therapy may reduce some of these symptoms and risks. Talk to your health care provider about whether hormone replacement therapy is right for you. Follow these instructions at home:  Schedule regular health, dental, and eye exams.  Stay current with your  immunizations.  Do not use any tobacco products including cigarettes, chewing tobacco, or electronic cigarettes.  If you are pregnant, do not drink alcohol.  If you are breastfeeding, limit how much and how often you drink alcohol.  Limit alcohol intake to no more than 1 drink per day for nonpregnant women. One drink equals 12 ounces of beer, 5 ounces of wine, or 1 ounces of hard liquor.  Do not use street drugs.  Do not share needles.  Ask your health care provider for help if you need support or information about quitting drugs.  Tell your  health care provider if you often feel depressed.  Tell your health care provider if you have ever been abused or do not feel safe at home. This information is not intended to replace advice given to you by your health care provider. Make sure you discuss any questions you have with your health care provider. Document Released: 12/21/2010 Document Revised: 11/13/2015 Document Reviewed: 03/11/2015 Elsevier Interactive Patient Education  2019 [facility]) Exercise Recommendation  Being physically active is important to prevent heart disease and stroke, the nation's No. 1and No. 5killers. To improve overall cardiovascular health, we suggest at least 150 minutes per week of moderate exercise or 75 minutes per week of vigorous exercise (or a combination of moderate and vigorous activity). Thirty minutes a day, five times a week is an easy goal to remember. You will also experience benefits even if you divide your time into two or three segments of 10 to 15 minutes per day.  For people who would benefit from lowering their blood pressure or cholesterol, we recommend 40 minutes of aerobic exercise of moderate to vigorous intensity three to four times a week to lower the risk for heart attack and stroke.  Physical activity is anything that makes you move your body and burn calories.  This includes things like climbing  stairs or playing sports. Aerobic exercises benefit your heart, and include walking, jogging, swimming or biking. Strength and stretching exercises are best for overall stamina and flexibility.  The simplest, positive change you can make to effectively improve your heart health is to start walking. It's enjoyable, free, easy, social and great exercise. A walking program is flexible and boasts high success rates because people can stick with it. It's easy for walking to become a regular and satisfying part of life.   For Overall Cardiovascular Health:  At least 30 minutes of moderate-intensity aerobic activity at least 5 days per week for a total of 150  OR   At least 25 minutes of vigorous aerobic activity at least 3 days per week for a total of 75 minutes; or a combination of moderate- and vigorous-intensity aerobic activity  AND   Moderate- to high-intensity muscle-strengthening activity at least 2 days per week for additional health benefits.  For Lowering Blood Pressure and Cholesterol  An average 40 minutes of moderate- to vigorous-intensity aerobic activity 3 or 4 times per week  What if I can't make it to the time goal? Something is always better than nothing! And everyone has to start somewhere. Even if you've been sedentary for years, today is the day you can begin to make healthy changes in your life. If you don't think you'll make it for 30 or 40 minutes, set a reachable goal for today. You can work up toward your overall goal by increasing your time as you get stronger. Don't let all-or-nothing thinking rob you of doing what you can every day.  Source:http://www.heart.org

## 2018-07-03 NOTE — Progress Notes (Signed)
Subjective:     Patient ID: Donna Crawford , female    DOB: 1961/06/16 , 58 y.o.   MRN: [id number]   Chief Complaint  Patient presents with  . Annual Exam   The patient states she uses menopausal for birth control. Last LMP was Patient's last menstrual period was 12/03/2012.. Negative for Dysmenorrhea and Negative for Menorrhagia Mammogram last done noted in Epic in 2004, has been ordered multiple times but has not had done. Negative for: breast discharge, breast lump(s), breast pain and breast self exam.  Pertinent negatives include abnormal bleeding (hematology), anxiety, decreased libido, depression, difficulty falling sleep, dyspareunia, history of infertility, nocturia, sexual dysfunction, sleep disturbances, urinary incontinence, urinary urgency, vaginal discharge and vaginal itching. Diet regular.The patient states her exercise level is  none.  She is now working at a desk job.     The patient's tobacco use is:  Social History   Tobacco Use  Smoking Status Never Smoker  Smokeless Tobacco Never Used   She has been exposed to passive smoke. The patient's alcohol use is:  Social History   Substance and Sexual Activity  Alcohol Use Yes   Comment: Occasional wine  . Additional information: Last pap, next one scheduled for .  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    Hypertension  This is a chronic problem. The current episode started more than 1 year ago. The problem is controlled. Pertinent negatives include no chest pain, headaches or palpitations. There are no associated agents to hypertension. There are no known risk factors for coronary artery disease. Past treatments include diuretics. The current treatment provides no improvement. There is no history of chronic renal disease.     Past Medical History:  Diagnosis Date  . H/O varicella   . History of measles, mumps, or rubella   . Hypertension   . Non-atypical  endometrial cells on cervical Pap smear 2008     Family History  Problem Relation Age of Onset  . Diabetes Mother   . Hypertension Mother   . Cancer Father   . Heart disease Father   . Hypertension Father   . Stroke Father   . Hypertension Sister   . Cancer Sister   . Diabetes Brother   . Hypertension Brother   . Diabetes Sister   . Hypertension Sister      Current Outpatient Medications:  .  Cholecalciferol (VITAMIN D) 2000 units tablet, Take 2,000 Units by mouth 2 (two) times daily., Disp: , Rfl:  .  glucosamine-chondroitin 500-400 MG tablet, Take 1 tablet by mouth 3 (three) times daily., Disp: , Rfl:  .  Melatonin 3 MG TABS, Take by mouth., Disp: , Rfl:  .  methocarbamol (ROBAXIN) 500 MG tablet, Take 1 tablet (500 mg total) by mouth 2 (two) times daily., Disp: 20 tablet, Rfl: 0 .  olmesartan-hydrochlorothiazide (BENICAR HCT) 20-12.5 MG tablet, TAKE 1 TABLET BY MOUTH ONCE DAILY, Disp: 90 tablet, Rfl: 1   No Known Allergies   Review of Systems  Constitutional: Negative.   HENT: Negative.   Eyes: Negative.   Respiratory: Negative.   Cardiovascular: Negative.  Negative for chest pain, palpitations and leg swelling.  Gastrointestinal: Negative.   Endocrine: Negative.  Negative for polydipsia, polyphagia and polyuria.  Genitourinary: Negative.   Musculoskeletal: Negative.   Skin: Negative.   Allergic/Immunologic: Negative.   Neurological: Negative.  Negative for dizziness and headaches.  Hematological: Negative.  Psychiatric/Behavioral: Negative.      Today's Vitals   07/03/18 1435  BP: 124/72  Pulse: 64  Temp: 98.4 F (36.9 C)  TempSrc: Oral  SpO2: 95%  Weight: (!) 316 lb 6.4 oz (143.5 kg)  Height: 5' 5.2" (1.656 m)  PainSc: 0-No pain   Body mass index is 52.33 kg/m.   Objective:  Physical Exam Constitutional:      General: She is not in acute distress.    Appearance: Normal appearance. She is well-developed. She is obese.  HENT:     Head:  Normocephalic and atraumatic.     Right Ear: Hearing, tympanic membrane, ear canal and external ear normal.     Left Ear: Hearing, tympanic membrane, ear canal and external ear normal.     Nose: Nose normal.     Mouth/Throat:     Mouth: Mucous membranes are moist.     Pharynx: Oropharynx is clear. No oropharyngeal exudate.  Eyes:     General: Lids are normal.     Conjunctiva/sclera: Conjunctivae normal.     Pupils: Pupils are equal, round, and reactive to light.     Funduscopic exam:    Right eye: No papilledema.        Left eye: No papilledema.  Neck:     Musculoskeletal: Full passive range of motion without pain, normal range of motion and neck supple.     Thyroid: No thyroid mass.     Vascular: No carotid bruit.  Cardiovascular:     Rate and Rhythm: Normal rate and regular rhythm.     Pulses: Normal pulses.     Heart sounds: Normal heart sounds. No murmur.  Pulmonary:     Effort: Pulmonary effort is normal. No respiratory distress.     Breath sounds: Normal breath sounds.  Chest:     Chest wall: No mass.     Breasts:        Right: Normal. No mass, nipple discharge or tenderness.        Left: Normal. No mass, nipple discharge or tenderness.  Abdominal:     General: Bowel sounds are normal. There is no distension.     Palpations: Abdomen is soft.     Tenderness: There is no abdominal tenderness.  Musculoskeletal: Normal range of motion.  Skin:    General: Skin is warm and dry.     Capillary Refill: Capillary refill takes less than 2 seconds.  Neurological:     General: No focal deficit present.     Mental Status: She is alert and oriented to person, place, and time.     Cranial Nerves: No cranial nerve deficit.     Sensory: No sensory deficit.  Psychiatric:        Mood and Affect: Mood normal.        Behavior: Behavior normal. Behavior is cooperative.        Thought Content: Thought content normal.        Judgment: Judgment normal.         Assessment And Plan:      1. Essential hypertension . B/P is well controlled.  . CMP ordered to check renal function.  . The importance of regular exercise and dietary modification was stressed to the patient.  . Stressed importance of losing ten percent of her body weight to help with B/P control.  . The weight loss would help with decreasing cardiac and cancer risk as well.  - BMP8+eGFR - CBC no Diff - Lipid panel  2. Class 3 severe obesity due to excess calories without serious comorbidity with body mass index (BMI) of 50.0 to 59.9 in adult [facility])  Chronic  Discussed healthy diet and regular exercise options   Encouraged to exercise at least 150 minutes per week with 2 days of strength training  Given handout for [email or web address]-[email or web address] 10 tips, CDC exercise for adults and CDC Eat More Weight Less  She is willing to try contrave  Return in 2 months for weight check  - Hemoglobin A1c - Referral to Nutrition and Diabetes Services - Naltrexone-buPROPion HCl ER (CONTRAVE) 8-90 MG TB12; Take 2 tablets by mouth 2 (two) times daily.  Dispense: 120 tablet; Refill: 1  3. Screening mammogram, encounter for  Pt instructed on Self Breast Exam.According to ACOG guidelines Women aged 23 and older are recommended to get an annual mammogram. Form completed and given to patient contact the [facility] for appointment scheduing.   Pt encouraged to get annual mammogram - MM Digital Screening; Future  4. Muscle cramps  Encouraged to stretch regularly   Her weight also plays a part in cramps  Encouraged to increase water intake - cyclobenzaprine (FLEXERIL) 10 MG tablet; Take 1 tablet (10 mg total) by mouth 3 (three) times daily as needed for muscle spasms.  Dispense: 30 tablet; Refill: 0  5. Routine general medical examination at a health care facility . Behavior modifications discussed and diet history reviewed. . Pt will continue to exercise regularly and modify diet with low GI, plant based foods and decrease intake  of processed foods.  . Recommend intake of daily multivitamin, Vitamin D, and calcium.  . Recommend mammogram for preventive screenings, as well as recommend immunizations that include influenza, TDAP - Hepatitis C antibody screen       Minette Brine, FNP

## 2018-07-04 LAB — BMP8+EGFR
BUN/Creatinine Ratio: 13 (ref 9–23)
BUN: 15 mg/dL (ref 6–24)
CALCIUM: 9.4 mg/dL (ref 8.7–10.2)
CO2: 25 mmol/L (ref 20–29)
Chloride: 100 mmol/L (ref 96–106)
Creatinine, Ser: 1.12 mg/dL — ABNORMAL HIGH (ref 0.57–1.00)
GFR calc Af Amer: 63 mL/min/{1.73_m2} (ref >59)
GFR, EST NON AFRICAN AMERICAN: 55 mL/min/{1.73_m2} — AB (ref >59)
Glucose: 79 mg/dL (ref 65–99)
Potassium: 3.9 mmol/L (ref 3.5–5.2)
Sodium: 140 mmol/L (ref 134–144)

## 2018-07-04 LAB — CBC
HEMATOCRIT: 36.5 % (ref 34.0–46.6)
HEMOGLOBIN: 11.9 g/dL (ref 11.1–15.9)
MCH: 27.2 pg (ref 26.6–33.0)
MCHC: 32.6 g/dL (ref 31.5–35.7)
MCV: 84 fL (ref 79–97)
Platelets: 321 10*3/uL (ref 150–450)
RBC: 4.37 x10E6/uL (ref 3.77–5.28)
RDW: 13.6 % (ref 11.7–15.4)
WBC: 5.5 10*3/uL (ref 3.4–10.8)

## 2018-07-04 LAB — LIPID PANEL
Chol/HDL Ratio: 4.5 ratio — ABNORMAL HIGH (ref 0.0–4.4)
Cholesterol, Total: 236 mg/dL — ABNORMAL HIGH (ref 100–199)
HDL: 53 mg/dL (ref >39)
LDL CALC: 157 mg/dL — AB (ref 0–99)
Triglycerides: 130 mg/dL (ref 0–149)
VLDL CHOLESTEROL CAL: 26 mg/dL (ref 5–40)

## 2018-07-04 LAB — HEMOGLOBIN A1C
Est. average glucose Bld gHb Est-mCnc: 117 mg/dL
Hgb A1c MFr Bld: 5.7 % — ABNORMAL HIGH (ref 4.8–5.6)

## 2018-07-05 LAB — HEPATITIS C ANTIBODY: Hep C Virus Ab: 0.1 s/co ratio (ref 0.0–0.9)

## 2018-07-20 ENCOUNTER — Encounter: Payer: Self-pay | Admitting: Nurse Practitioner

## 2018-08-08 ENCOUNTER — Other Ambulatory Visit: Payer: Self-pay | Admitting: Internal Medicine

## 2018-08-08 MED FILL — OLMESARTAN-HCTZ 20-12.5 MG: 20-12.5 | 30 days supply | Qty: 30 | Fill #1

## 2018-08-09 MED FILL — SM VITAMIN D3 2,000 UNIT SF: 50 MCG | 30 days supply | Qty: 60 | Fill #0

## 2018-08-16 ENCOUNTER — Encounter: Payer: 59 | Attending: Internal Medicine | Admitting: Registered"

## 2018-08-16 ENCOUNTER — Encounter: Payer: Self-pay | Admitting: Registered"

## 2018-08-16 DIAGNOSIS — Z6841 Body Mass Index (BMI) 40.0 and over, adult: Secondary | ICD-10-CM | POA: Diagnosis not present

## 2018-08-16 NOTE — Patient Instructions (Addendum)
Instructions/Goals:   Make sure to get in three meals per day. Try to have balanced meals like the My Plate example (see handout). Include lean proteins, vegetables, fruits, and whole grains at meals.   Goal: Have 3 meals per day: Plan out breakfasts that are quick and you can plan ahead of time (See handout).  For lunch and dinner, recommend planning out ahead of time, especially once school starts to help have healthy meals while having busy schedule.   Starting Water Goal: 48 oz daily; ultimate goal of 64 oz per day.  Include heart healthy fats (See handout)  Limit items high in sodium (those with 20% or more DV) and choose more with 5% or less per serving.   Make physical activity a part of your week. Try to include at least 30 minutes of physical activity 5 days each week or at least 150 minutes per week. Regular physical activity promotes overall health-including helping to reduce risk for heart disease and diabetes, promoting mental health, and helping [place] sleep better.    Starting Goal: Include at least 20 minutes x 3 days each week.

## 2018-08-16 NOTE — Progress Notes (Signed)
Medical Nutrition Therapy:  Appt start time: 0938 end time:  1050.  Assessment:  Primary concerns today: Pt referred due to weight management. Noted pt has been dx with  HTN. Pt reports that she feels like she eats on the go all the time. Reports she is getting ready to start back to school in May. 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 Reports she sometimes has oatmeal with Michael sugar, walnuts, and peanut butter from the cafeteria but not every days. Reports getting it about 3 days per week and often skips breakfast the other days. Pt reports she has been trying to have healthier snacks and avoid things like regular potato chips. Reports she has been including some [employer], chickpea/sweet potato chips, cheese, apples as snacks sometimes. Reports she has tried [place] yogurt but did not like it. Is willing to try it with toppings such as fruit, nuts, etc. Pt reports that she is an emotional eater. Pt has been prescribed Contrave for weight loss but has not yet started the medication due to issues with insurance coverage.   Food Allergies/Intolerances: None reported.   GI Concerns: None reported.   Pertinent Lab Values: 07/03/18: HgbA1c: 5.7 LDL Cholesterol: 157 Chol/HDL Ratio: 4.5  Sleep Pattern: 6 hours of sleep most nights.   Preferred Learning Style:   No preference indicated   Learning Readiness:   Ready  MEDICATIONS: See list.    DIETARY INTAKE:  Usual eating pattern includes 2-3 meals and 3 snacks per day. Pt reports she does not always have breakfast. Reports being an emotional eater at times.   Common foods: oatmeal.  Avoided foods: most fish. Does like tuna ,canned salmon, and some shellfish.     Typical Snacks: cheese sticks, apple.      Typical Beverages: water; tea sweetened with stevia.   Location of Meals: When at [employer], lounge. At home-in bedroom.   Electronics  Present at [employer]: Yes: TV and/or phone usually present.   24-hr recall:  B ( AM): Water; tea sweetened with stevia.   Snk ( AM): None reported.    L (2 PM): tuna packet, salad with lettuce, tomato, onion, pomegranate vinaigrette, tea sweetened with stevia.  Snk ( PM): apple, cheese stick, water, tea.  D (7 PM): grilled cheese and bacon sandwich on whole wheat bread, tea.  Snk ( PM): vanilla ice cream-1 pint Beverages: around 1 L water daily; tea sweetened with stevia.   Usual physical activity: None reported   Minutes/Week: None currently.   Progress Towards Goal(s):  In progress.   Nutritional Diagnosis:  NB-2.1 Physical inactivity As related to sedentary lifestyle .  As evidenced by pt's reported activity recall . NI-5.11.1 Predicted suboptimal nutrient intake As related to skipping breakfast, inadequate water intake.  As evidenced by pt's reported dietary recall and habits .    Intervention:  Nutrition counseling provided. Dietitian discussed pt's lab values. Provided education regarding relationship between dietary intake and blood sugar management. Provided education on balanced and heart healthy nutrition and mindful eating. Discussed how to plan quick and balanced meals as pt reports one of her main struggles include having a busy schedule. Discussed trying [place] yogurt with fruit, nuts added as pt wanted to know how to make it taste better/less tangy. Could add a granola that has a good source of protein and fiber (7g or more protein, 3 g or more fiber) and is low  in sugar. Worked with pt to set personalized nutrition and activity goals. Pt appeared agreeable to information/goals discussed.   Instructions/Goals:   Make sure to get in three meals per day. Try to have balanced meals like the My Plate example (see handout). Include lean proteins, vegetables, fruits, and whole grains at meals.   Goal: Have 3 meals per day: Plan out breakfasts that are quick and you can plan ahead of  time (See handout).  For lunch and dinner, recommend planning out ahead of time, especially once school starts to help have healthy meals while having busy schedule.   Starting Water Goal: 48 oz daily; ultimate goal of 64 oz per day.  Include heart healthy fats (See handout)  Limit items high in sodium (those with 20% or more DV) and choose more with 5% or less per serving.   Make physical activity a part of your week. Try to include at least 30 minutes of physical activity 5 days each week or at least 150 minutes per week. Regular physical activity promotes overall health-including helping to reduce risk for heart disease and diabetes, promoting mental health, and helping [place] sleep better.    Starting Goal: Include at least 20 minutes x 3 days each week.  Teaching Method Utilized:  Visual Auditory  Handouts given during visit include:  Balanced plate and food list.   Heart Healthy Nutrition   Balanced Snacks  Barriers to learning/adherence to lifestyle change: Busy schedule.   Demonstrated degree of understanding via:  Teach Back   Monitoring/Evaluation:  Dietary intake, exercise, and body weight prn. Pt would like to call back regarding follow-up after she checks with insurance provider.

## 2018-08-18 ENCOUNTER — Encounter: Payer: Self-pay | Admitting: Internal Medicine

## 2018-08-30 ENCOUNTER — Ambulatory Visit: Payer: 59 | Admitting: Nurse Practitioner

## 2018-08-30 ENCOUNTER — Other Ambulatory Visit: Payer: Self-pay

## 2018-08-30 ENCOUNTER — Encounter: Payer: Self-pay | Admitting: Nurse Practitioner

## 2018-08-30 VITALS — BP 132/92 | HR 76 | Ht 69.0 in | Wt 319.0 lb

## 2018-08-30 DIAGNOSIS — I10 Essential (primary) hypertension: Secondary | ICD-10-CM | POA: Diagnosis not present

## 2018-08-30 DIAGNOSIS — Z6841 Body Mass Index (BMI) 40.0 and over, adult: Secondary | ICD-10-CM

## 2018-08-30 DIAGNOSIS — Z111 Encounter for screening for respiratory tuberculosis: Secondary | ICD-10-CM

## 2018-08-30 NOTE — Progress Notes (Signed)
Subjective:     Patient ID: Donna Crawford , female    DOB: June 23, 1960 , 58 y.o.   MRN: [id number]   Chief Complaint  Patient presents with  . WEIGHT CHECK    pt didn't get her meds for weight loss     HPI  Obesity - she never received the Contrave, she is planning to do natural.    She will be changing jobs at the end of the week.  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  She needs a form completed.    Past Medical History:  Diagnosis Date  . H/O varicella   . History of measles, mumps, or rubella   . Hypertension   . Non-atypical endometrial cells on cervical Pap smear 2008     Family History  Problem Relation Age of Onset  . Diabetes Mother   . Hypertension Mother   . Cancer Father   . Heart disease Father   . Hypertension Father   . Stroke Father   . Hypertension Sister   . Cancer Sister   . Diabetes Brother   . Hypertension Brother   . Diabetes Sister   . Hypertension Sister      Current Outpatient Medications:  .  Cholecalciferol (VITAMIN D) 2000 units tablet, Take 2,000 Units by mouth daily. , Disp: , Rfl:  .  Cyanocobalamin (B-12 PO), Take by mouth., Disp: , Rfl:  .  cyclobenzaprine (FLEXERIL) 10 MG tablet, Take 1 tablet (10 mg total) by mouth 3 (three) times daily as needed for muscle spasms., Disp: 30 tablet, Rfl: 0 .  glucosamine-chondroitin 500-400 MG tablet, Take 1 tablet by mouth 1 day or 1 dose. , Disp: , Rfl:  .  Melatonin 3 MG TABS, Take by mouth., Disp: , Rfl:  .  olmesartan-hydrochlorothiazide (BENICAR HCT) 20-12.5 MG tablet, TAKE 1 TABLET BY MOUTH ONCE DAILY, Disp: 90 tablet, Rfl: 1 .  Omega-3 Fatty Acids (OMEGA 3 PO), Take by mouth., Disp: , Rfl:  .  Naltrexone-buPROPion HCl ER (CONTRAVE) 8-90 MG TB12, Take 2 tablets by mouth 2 (two) times daily. (Patient not taking: Reported on 08/30/2018), Disp: 120 tablet, Rfl: 1   No Known Allergies   Review of Systems  Constitutional: Negative for fatigue and  fever.  Respiratory: Negative for cough.   Cardiovascular: Negative.  Negative for chest pain, palpitations and leg swelling.  Musculoskeletal: Negative.      Today's Vitals   08/30/18 1044  BP: (!) 132/92  Pulse: 76  SpO2: 95%  Weight: (!) 319 lb (144.7 kg)  Height: 5\' 9"  (1.753 m)   Body mass index is 47.11 kg/m.   Objective:  Physical Exam Vitals signs reviewed.  Constitutional:      General: She is not in acute distress.    Appearance: Normal appearance. She is obese.  Cardiovascular:     Rate and Rhythm: Normal rate and regular rhythm.     Pulses: Normal pulses.     Heart sounds: Normal heart sounds. No murmur.  Pulmonary:     Effort: Pulmonary effort is normal. No respiratory distress.     Breath sounds: Normal breath sounds. No wheezing or rales.  Neurological:     General: No focal deficit present.     Mental Status: She is alert and oriented to person, place, and time.  Psychiatric:        Mood and Affect: Mood normal.        Behavior: Behavior  normal.        Thought Content: Thought content normal.        Judgment: Judgment normal.         Assessment And Plan:     1. Class 3 severe obesity due to excess calories without serious comorbidity with body mass index (BMI) of 50.0 to 59.9 in adult [facility])  Chronic  Discussed healthy diet and regular exercise options   Encouraged to exercise at least 150 minutes per week with 2 days of strength training  She is not interested in any medications at this time  2. 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 Essential hypertension . B/P is fairly controlled. Elevated slightly today . She is advised to limit her intake of high salt foods . The importance of regular exercise and dietary modification was stressed to the patient.  . Stressed importance of losing ten percent of her body weight to help with B/P control.  . The  weight loss would help with decreasing cardiac and cancer risk as well.       Minette Brine, FNP

## 2018-09-06 ENCOUNTER — Other Ambulatory Visit: Payer: Self-pay

## 2018-09-06 MED ORDER — VITAMIN D 50 MCG (2000 UT) PO TABS: 2000.0000 [IU] | ORAL_TABLET | Freq: Every day | ORAL | 0 refills | Status: DC

## 2018-09-06 MED ORDER — OLMESARTAN MEDOXOMIL-HCTZ 20-12.5 MG PO TABS: 1.0000 | ORAL_TABLET | Freq: Every day | ORAL | 0 refills | Status: DC

## 2018-09-06 MED ORDER — GLUCOSAMINE-CHONDROITIN 500-400 MG PO TABS: 1.0000 | ORAL_TABLET | ORAL | 0 refills | Status: AC

## 2018-09-08 MED FILL — OLMESARTAN-HCTZ 20-12.5 MG: 20-12.5 | 90 days supply | Qty: 90 | Fill #0

## 2018-09-08 MED FILL — VITAMIN D-3 2,000 UNIT TAB: 50 MCG | 90 days supply | Qty: 90 | Fill #0

## 2018-09-17 ENCOUNTER — Encounter: Payer: Self-pay | Admitting: Nurse Practitioner

## 2018-11-15 ENCOUNTER — Ambulatory Visit: Payer: Self-pay | Admitting: Nurse Practitioner

## 2018-11-30 ENCOUNTER — Ambulatory Visit: Payer: 59 | Admitting: Nurse Practitioner

## 2018-12-07 ENCOUNTER — Encounter: Payer: Self-pay | Admitting: Nurse Practitioner

## 2018-12-07 ENCOUNTER — Ambulatory Visit (INDEPENDENT_AMBULATORY_CARE_PROVIDER_SITE_OTHER): Payer: BC Managed Care – PPO | Admitting: Nurse Practitioner

## 2018-12-07 ENCOUNTER — Other Ambulatory Visit: Payer: Self-pay

## 2018-12-07 VITALS — BP 110/78 | HR 68 | Temp 97.8°F | Ht 69.0 in | Wt 306.4 lb

## 2018-12-07 DIAGNOSIS — I10 Essential (primary) hypertension: Secondary | ICD-10-CM

## 2018-12-07 DIAGNOSIS — R252 Cramp and spasm: Secondary | ICD-10-CM | POA: Diagnosis not present

## 2018-12-07 DIAGNOSIS — Z6841 Body Mass Index (BMI) 40.0 and over, adult: Secondary | ICD-10-CM | POA: Diagnosis not present

## 2018-12-07 MED ORDER — CYCLOBENZAPRINE HCL 10 MG PO TABS: 10.0000 mg | ORAL_TABLET | Freq: Three times a day (TID) | ORAL | 1 refills | Status: DC | PRN

## 2018-12-07 NOTE — Patient Instructions (Signed)
   Magnesium 250 mg in evening for muscle spasms  Stretch regularly   Use tennis ball to go over muscles   Use tumeric with your food

## 2018-12-07 NOTE — Progress Notes (Signed)
Subjective:     Patient ID: Donna Crawford , female    DOB: 19-Dec-1960 , 58 y.o.   MRN: [id number]   Chief Complaint  Patient presents with  . Hypertension    HPI  Weight - she has been decreasing her portions of food and eating healthy snacks.  She is eating more fruits and vegetables.  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    Hypertension This is a chronic problem. The current episode started more than 1 year ago. The problem is controlled. Pertinent negatives include no anxiety, chest pain, headaches or palpitations. There are no associated agents to hypertension. There are no known risk factors for coronary artery disease. There are no compliance problems.  There is no history of angina. There is no history of chronic renal disease.     Past Medical History:  Diagnosis Date  . H/O varicella   . History of measles, mumps, or rubella   . Hypertension   . Non-atypical endometrial cells on cervical Pap smear 2008     Family History  Problem Relation Age of Onset  . Diabetes Mother   . Hypertension Mother   . Cancer Father   . Heart disease Father   . Hypertension Father   . Stroke Father   . Hypertension Sister   . Cancer Sister   . Diabetes Brother   . Hypertension Brother   . Diabetes Sister   . Hypertension Sister      Current Outpatient Medications:  .  Ascorbic Acid (VITAMIN C PO), Take by mouth., Disp: , Rfl:  .  Cholecalciferol (VITAMIN D) 50 MCG (2000 UT) tablet, Take 1 tablet (2,000 Units total) by mouth daily., Disp: 90 tablet, Rfl: 0 .  Cyanocobalamin (B-12 PO), Take by mouth., Disp: , Rfl:  .  cyclobenzaprine (FLEXERIL) 10 MG tablet, Take 1 tablet (10 mg total) by mouth 3 (three) times daily as needed for muscle spasms., Disp: 30 tablet, Rfl: 0 .  glucosamine-chondroitin 500-400 MG tablet, Take 1 tablet by mouth 1 day or 1 dose., Disp: 90 tablet, Rfl: 0 .  Melatonin 3 MG  TABS, Take by mouth., Disp: , Rfl:  .  Multiple Vitamins-Minerals (ZINC PO), Take by mouth., Disp: , Rfl:  .  olmesartan-hydrochlorothiazide (BENICAR HCT) 20-12.5 MG tablet, Take 1 tablet by mouth daily., Disp: 90 tablet, Rfl: 0 .  Omega-3 Fatty Acids (OMEGA 3 PO), Take by mouth., Disp: , Rfl:  .  Naltrexone-buPROPion HCl ER (CONTRAVE) 8-90 MG TB12, Take 2 tablets by mouth 2 (two) times daily. (Patient not taking: Reported on 12/07/2018), Disp: 120 tablet, Rfl: 1   No Known Allergies   Review of Systems  Constitutional: Negative.   Respiratory: Negative.   Cardiovascular: Negative.  Negative for chest pain, palpitations and leg swelling.  Neurological: Negative for dizziness and headaches.  Psychiatric/Behavioral: Negative.      Today's Vitals   12/07/18 1523  BP: 110/78  Pulse: 68  Temp: 97.8 F (36.6 C)  TempSrc: Oral  Weight: (!) 306 lb 6.4 oz (139 kg)  Height: 5\' 9"  (1.753 m)   Body mass index is 45.25 kg/m.   Objective:  Physical Exam Constitutional:      Appearance: Normal appearance.  Cardiovascular:     Rate and Rhythm: Normal rate and regular rhythm.     Pulses: Normal pulses.     Heart sounds: Normal  heart sounds. No murmur.  Pulmonary:     Effort: Pulmonary effort is normal. No respiratory distress.     Breath sounds: Normal breath sounds.  Musculoskeletal: Normal range of motion.        General: No tenderness.  Skin:    General: Skin is warm and dry.     Capillary Refill: Capillary refill takes less than 2 seconds.  Neurological:     General: No focal deficit present.     Mental Status: She is alert and oriented to person, place, and time.  Psychiatric:        Mood and Affect: Mood normal.        Behavior: Behavior normal.        Thought Content: Thought content normal.        Judgment: Judgment normal.         Assessment And Plan:     1. Muscle cramps  Low back spasms particularly when she is standing and sitting for long periods  Encouraged  to stretch regularly - cyclobenzaprine (FLEXERIL) 10 MG tablet; Take 1 tablet (10 mg total) by mouth 3 (three) times daily as needed for muscle spasms.  Dispense: 30 tablet; Refill: 1  2. Essential hypertension . B/P is well controlled.  Marland Kitchen BMP ordered to check renal function.  . The importance of regular exercise and dietary modification was stressed to the patient.  . Stressed importance of losing ten percent of her body weight to help with B/P control.  - BMP8+Anion Gap - Hemoglobin A1c  3. Body mass index (BMI) of 45.0-49.9 in adult [facility])  Chronic  Discussed healthy diet and regular exercise options   Encouraged to exercise at least 150 minutes per week with 2 days of strength training  She has lost 13 lbs since her last visit, congratulated her on this accomplishment.    Minette Brine, FNP    THE PATIENT IS ENCOURAGED TO PRACTICE SOCIAL DISTANCING DUE TO THE COVID-19 PANDEMIC.

## 2018-12-08 LAB — BMP8+ANION GAP
Anion Gap: 16 mmol/L (ref 10.0–18.0)
BUN/Creatinine Ratio: 11 (ref 9–23)
BUN: 11 mg/dL (ref 6–24)
CO2: 25 mmol/L (ref 20–29)
Calcium: 9.3 mg/dL (ref 8.7–10.2)
Chloride: 100 mmol/L (ref 96–106)
Creatinine, Ser: 1.03 mg/dL — ABNORMAL HIGH (ref 0.57–1.00)
GFR calc Af Amer: 70 mL/min/{1.73_m2} (ref >59)
GFR calc non Af Amer: 61 mL/min/{1.73_m2} (ref >59)
Glucose: 85 mg/dL (ref 65–99)
Potassium: 3.6 mmol/L (ref 3.5–5.2)
Sodium: 141 mmol/L (ref 134–144)

## 2018-12-08 LAB — HEMOGLOBIN A1C
Est. average glucose Bld gHb Est-mCnc: 120 mg/dL
Hgb A1c MFr Bld: 5.8 % — ABNORMAL HIGH (ref 4.8–5.6)

## 2019-01-01 MED FILL — OLMESARTAN-HCTZ 20-12.5 MG: 20-12.5 | 30 days supply | Qty: 30 | Fill #2

## 2019-02-08 MED FILL — OLMESARTAN-HCTZ 20-12.5 MG: 20-12.5 | 30 days supply | Qty: 30 | Fill #3

## 2019-02-22 MED FILL — CYCLOBENZAPRINE HCL 10 MG T: 10 | 10 days supply | Qty: 30 | Fill #0

## 2019-03-19 MED FILL — OLMESARTAN-HCTZ 20-12.5 MG: 20-12.5 | 30 days supply | Qty: 30 | Fill #4

## 2019-04-20 MED FILL — OLMESARTAN-HCTZ 20-12.5 MG: 20-12.5 | 30 days supply | Qty: 30 | Fill #5

## 2019-05-12 ENCOUNTER — Other Ambulatory Visit: Payer: Self-pay | Admitting: Internal Medicine

## 2019-06-30 MED FILL — OLMESARTAN-HCTZ 20-12.5 MG: 20-12.5 | 30 days supply | Qty: 30 | Fill #1

## 2019-07-09 ENCOUNTER — Encounter: Payer: BC Managed Care – PPO | Admitting: Nurse Practitioner

## 2019-08-03 ENCOUNTER — Other Ambulatory Visit: Payer: Self-pay | Admitting: Nurse Practitioner

## 2019-08-03 MED FILL — OLMESARTAN-HCTZ 20-12.5 MG: 20-12.5 | 30 days supply | Qty: 30 | Fill #0

## 2019-09-10 ENCOUNTER — Other Ambulatory Visit: Payer: Self-pay | Admitting: Nurse Practitioner

## 2019-09-10 ENCOUNTER — Telehealth: Payer: Self-pay

## 2019-09-10 ENCOUNTER — Other Ambulatory Visit: Payer: Self-pay

## 2019-09-10 MED ORDER — OLMESARTAN MEDOXOMIL-HCTZ 20-12.5 MG PO TABS: 1.0000 | ORAL_TABLET | Freq: Every day | ORAL | 0 refills | Status: DC

## 2019-09-10 MED FILL — OLMESARTAN-HCTZ 20-12.5 MG: 20-12.5 | 30 days supply | Qty: 30 | Fill #0

## 2019-09-10 NOTE — Telephone Encounter (Signed)
Pt has not been seen since 11/2018 has a refill request Olmesartan called pt to schedule an appt pt stated she is at work at the moment and she will have to call the office back.

## 2019-09-10 NOTE — Telephone Encounter (Signed)
She has been seeing Janece, you can send her 30 days only. Let her know she needs to have an appt with JM w/in 30 days in order to get further refills

## 2019-10-15 ENCOUNTER — Other Ambulatory Visit: Payer: Self-pay | Admitting: Nurse Practitioner

## 2019-10-15 MED FILL — OLMESARTAN-HCTZ 20-12.5 MG: 20-12.5 | 30 days supply | Qty: 30 | Fill #0

## 2019-10-25 ENCOUNTER — Ambulatory Visit: Payer: Self-pay | Admitting: Nurse Practitioner

## 2019-11-21 ENCOUNTER — Other Ambulatory Visit: Payer: Self-pay

## 2019-11-21 ENCOUNTER — Encounter: Payer: Self-pay | Admitting: Nurse Practitioner

## 2019-11-21 ENCOUNTER — Ambulatory Visit: Payer: Self-pay | Admitting: Nurse Practitioner

## 2019-11-21 VITALS — BP 122/80 | HR 74 | Temp 98.1°F | Ht 64.8 in | Wt 322.8 lb

## 2019-11-21 DIAGNOSIS — I10 Essential (primary) hypertension: Secondary | ICD-10-CM

## 2019-11-21 DIAGNOSIS — Z1231 Encounter for screening mammogram for malignant neoplasm of breast: Secondary | ICD-10-CM

## 2019-11-21 DIAGNOSIS — R5383 Other fatigue: Secondary | ICD-10-CM

## 2019-11-21 DIAGNOSIS — E782 Mixed hyperlipidemia: Secondary | ICD-10-CM

## 2019-11-21 DIAGNOSIS — Z01419 Encounter for gynecological examination (general) (routine) without abnormal findings: Secondary | ICD-10-CM

## 2019-11-21 DIAGNOSIS — Z6841 Body Mass Index (BMI) 40.0 and over, adult: Secondary | ICD-10-CM

## 2019-11-21 DIAGNOSIS — R609 Edema, unspecified: Secondary | ICD-10-CM

## 2019-11-21 MED ORDER — OLMESARTAN MEDOXOMIL-HCTZ 20-12.5 MG PO TABS: 1.0000 | ORAL_TABLET | Freq: Every day | ORAL | 0 refills | Status: DC

## 2019-11-21 MED FILL — OLMESARTAN-HCTZ 20-12.5 MG: 20-12.5 | 30 days supply | Qty: 30 | Fill #0

## 2019-11-21 NOTE — Progress Notes (Signed)
Subjective:     Patient ID: Donna Crawford , female    DOB: Sep 23, 1960 , 60 y.o.   MRN: [id number]   Chief Complaint  Patient presents with  . Hypertension    HPI  HTN-Eudora Fard presents today for BP medication check and refill. She is doing well with the medication and is wanting to try a low carb diet and increase her walking. She reporting feeling tired and thinks that her B-12 is low. She is working three jobs and feels this is contributing to her limited exercise and diet. She also reports hot flashes that are greater at night. She would like a referral to a GYN.   Wt Readings from Last 3 Encounters: 11/21/19 : (!) 322 lb 12.8 oz (146.4 kg) 12/07/18 : (!) 306 lb 6.4 oz (139 kg) 08/30/18 : (!) 319 lb (144.7 kg)       Hypertension This is a chronic problem. The current episode started more than 1 year ago. The problem is controlled. Associated symptoms include peripheral edema. Pertinent negatives include no chest pain, headaches or palpitations. (Contributed to being on her feet for 12 hrs per day and is relieved by elevation.) There are no associated agents to hypertension. Risk factors for coronary artery disease include sedentary lifestyle and obesity. Past treatments include diuretics and angiotensin blockers. The current treatment provides moderate improvement. There are no compliance problems.  There is no history of angina. There is no history of chronic renal disease.     Past Medical History:  Diagnosis Date  . H/O varicella   . History of measles, mumps, or rubella   . Hypertension   . Non-atypical endometrial cells on cervical Pap smear 2008     Family History  Problem Relation Age of Onset  . Diabetes Mother   . Hypertension Mother   . Cancer Father   . Heart disease Father   . Hypertension Father   . Stroke Father   . Hypertension Sister   . Cancer Sister   . Diabetes Brother   . Hypertension Brother   . Diabetes Sister   . Hypertension Sister       Current Outpatient Medications:  .  Ascorbic Acid (VITAMIN C PO), Take by mouth., Disp: , Rfl:  .  Cholecalciferol (VITAMIN D) 50 MCG (2000 UT) tablet, Take 1 tablet (2,000 Units total) by mouth daily., Disp: 90 tablet, Rfl: 0 .  Cyanocobalamin (B-12 PO), Take by mouth., Disp: , Rfl:  .  cyclobenzaprine (FLEXERIL) 10 MG tablet, Take 1 tablet (10 mg total) by mouth 3 (three) times daily as needed for muscle spasms., Disp: 30 tablet, Rfl: 1 .  glucosamine-chondroitin 500-400 MG tablet, Take 1 tablet by mouth 1 day or 1 dose., Disp: 90 tablet, Rfl: 0 .  Melatonin 3 MG TABS, Take by mouth., Disp: , Rfl:  .  Multiple Vitamins-Minerals (ZINC PO), Take by mouth., Disp: , Rfl:  .  Naltrexone-buPROPion HCl ER (CONTRAVE) 8-90 MG TB12, Take 2 tablets by mouth 2 (two) times daily. (Patient not taking: Reported on 12/07/2018), Disp: 120 tablet, Rfl: 1 .  olmesartan-hydrochlorothiazide (BENICAR HCT) 20-12.5 MG tablet, TAKE 1 TABLET BY MOUTH DAILY. MUST MAKE APPT FOR REFILLS, Disp: 30 tablet, Rfl: 0 .  Omega-3 Fatty Acids (OMEGA 3 PO), Take by mouth., Disp: , Rfl:    No Known Allergies   Review of Systems  Constitutional: Negative.   HENT: Negative.   Eyes: Negative.   Respiratory: Negative.  Negative for cough.   Cardiovascular:  Negative.  Negative for chest pain, palpitations and leg swelling.  Neurological: Negative for dizziness and headaches.  Psychiatric/Behavioral: Negative.      There were no vitals filed for this visit. There is no height or weight on file to calculate BMI.   Objective:  Physical Exam Vitals reviewed.  Constitutional:      General: She is not in acute distress.    Appearance: Normal appearance. She is obese.  Cardiovascular:     Rate and Rhythm: Normal rate and regular rhythm.     Pulses: Normal pulses.     Heart sounds: Normal heart sounds.  Pulmonary:     Effort: Pulmonary effort is normal.     Breath sounds: Normal breath sounds.  Musculoskeletal:      Right lower leg: Edema present.     Left lower leg: Edema present.  Skin:    General: Skin is warm and dry.  Neurological:     General: No focal deficit present.     Mental Status: She is alert and oriented to person, place, and time.     Cranial Nerves: No cranial nerve deficit.  Psychiatric:        Mood and Affect: Mood normal.        Behavior: Behavior normal.        Thought Content: Thought content normal.        Judgment: Judgment normal.         Assessment And Plan:     1. Essential hypertension  Chronic, good control  Continue with current medications  Encouraged to be sure to have routine follow up for kidney function  2. Class 3 severe obesity due to excess calories without serious comorbidity with body mass index (BMI) of 50.0 to 59.9 in adult [facility])  She has had a sixteen pound weigh gain over the past year. She is planning a new low carb diet and will also start an exercise routine of walking  She has been     Marylu Lund, RN    THE PATIENT IS ENCOURAGED TO PRACTICE SOCIAL DISTANCING DUE TO THE COVID-19 PANDEMIC.

## 2019-11-22 LAB — BMP8+EGFR
BUN/Creatinine Ratio: 13 (ref 9–23)
BUN: 12 mg/dL (ref 6–24)
CO2: 28 mmol/L (ref 20–29)
Calcium: 9.5 mg/dL (ref 8.7–10.2)
Chloride: 101 mmol/L (ref 96–106)
Creatinine, Ser: 0.93 mg/dL (ref 0.57–1.00)
GFR calc Af Amer: 79 mL/min/{1.73_m2} (ref >59)
GFR calc non Af Amer: 68 mL/min/{1.73_m2} (ref >59)
Glucose: 77 mg/dL (ref 65–99)
Potassium: 4.1 mmol/L (ref 3.5–5.2)
Sodium: 141 mmol/L (ref 134–144)

## 2019-11-22 LAB — LIPID PANEL
Chol/HDL Ratio: 4.6 ratio — ABNORMAL HIGH (ref 0.0–4.4)
Cholesterol, Total: 232 mg/dL — ABNORMAL HIGH (ref 100–199)
HDL: 50 mg/dL (ref >39)
LDL Chol Calc (NIH): 160 mg/dL — ABNORMAL HIGH (ref 0–99)
Triglycerides: 120 mg/dL (ref 0–149)
VLDL Cholesterol Cal: 22 mg/dL (ref 5–40)

## 2019-11-22 LAB — VITAMIN B12: Vitamin B-12: >2000 pg/mL — ABNORMAL HIGH (ref 232–1245)

## 2019-11-22 LAB — TSH: TSH: 3.14 u[IU]/mL (ref 0.450–4.500)

## 2019-11-22 LAB — HEMOGLOBIN A1C
Est. average glucose Bld gHb Est-mCnc: 117 mg/dL
Hgb A1c MFr Bld: 5.7 % — ABNORMAL HIGH (ref 4.8–5.6)

## 2019-12-28 ENCOUNTER — Other Ambulatory Visit: Payer: Self-pay | Admitting: Nurse Practitioner

## 2019-12-28 DIAGNOSIS — I10 Essential (primary) hypertension: Secondary | ICD-10-CM

## 2019-12-31 ENCOUNTER — Other Ambulatory Visit: Payer: Self-pay | Admitting: Nurse Practitioner

## 2019-12-31 MED FILL — OLMESARTAN-HCTZ 20-12.5 MG: 20-12.5 | 90 days supply | Qty: 90 | Fill #0

## 2020-03-26 ENCOUNTER — Ambulatory Visit: Payer: Self-pay | Admitting: Nurse Practitioner

## 2020-04-07 ENCOUNTER — Ambulatory Visit (INDEPENDENT_AMBULATORY_CARE_PROVIDER_SITE_OTHER): Payer: 59 | Admitting: Nurse Practitioner

## 2020-04-07 ENCOUNTER — Encounter: Payer: Self-pay | Admitting: Nurse Practitioner

## 2020-04-07 ENCOUNTER — Other Ambulatory Visit: Payer: Self-pay

## 2020-04-07 VITALS — BP 138/86 | HR 71 | Temp 98.0°F | Ht 64.8 in | Wt 330.0 lb

## 2020-04-07 DIAGNOSIS — I10 Essential (primary) hypertension: Secondary | ICD-10-CM

## 2020-04-07 DIAGNOSIS — Z6841 Body Mass Index (BMI) 40.0 and over, adult: Secondary | ICD-10-CM

## 2020-04-07 DIAGNOSIS — Z1231 Encounter for screening mammogram for malignant neoplasm of breast: Secondary | ICD-10-CM

## 2020-04-07 NOTE — Patient Instructions (Addendum)

## 2020-04-07 NOTE — Progress Notes (Signed)
I,Yamilka Roman [employer] as a Education administrator for [employer], FNP.,have documented all relevant documentation on the behalf of Minette Brine, FNP,as directed by  Minette Brine, FNP while in the presence of Minette Brine, [place]. This visit occurred during the SARS-CoV-2 public health emergency.  Safety protocols were in place, including screening questions prior to the visit, additional usage of staff PPE, and extensive cleaning of exam room while observing appropriate contact time as indicated for disinfecting solutions.  Subjective:     Patient ID: Donna Crawford , female    DOB: 07-Mar-1961 , 59 y.o.   MRN: [id number]   Chief Complaint  Patient presents with  . Hypertension    HPI  Patient is here for a blood pressure f/u.    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 She is working at [employer] 2 days a week.  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      Hypertension This is a chronic problem. The current episode started more than 1 year ago. The problem is controlled. Associated symptoms include peripheral edema. Pertinent negatives include no chest pain, headaches or palpitations. (Contributed to being on her feet for 12 hrs per day and is relieved by elevation.) There are no associated agents to hypertension. Risk factors for coronary artery disease include sedentary lifestyle and obesity. Past treatments include diuretics and angiotensin blockers. The current treatment provides moderate improvement. There are no compliance problems.  There is no history of angina. There is no history of chronic renal disease.     Past Medical History:  Diagnosis Date  . H/O varicella   . History of measles, mumps, or rubella   . Hypertension   . Non-atypical  endometrial cells on cervical Pap smear 2008     Family History  Problem Relation Age of Onset  . Diabetes Mother   . Hypertension Mother   . Cancer Father   . Heart disease Father   . Hypertension Father   . Stroke Father   . Hypertension Sister   . Cancer Sister   . Diabetes Brother   . Hypertension Brother   . Diabetes Sister   . Hypertension Sister      Current Outpatient Medications:  .  Ascorbic Acid (VITAMIN C PO), Take by mouth., Disp: , Rfl:  .  Cholecalciferol (VITAMIN D) 50 MCG (2000 UT) tablet, Take 1 tablet (2,000 Units total) by mouth daily., Disp: 90 tablet, Rfl: 0 .  Cyanocobalamin (B-12 PO), Take by mouth., Disp: , Rfl:  .  glucosamine-chondroitin 500-400 MG tablet, Take 1 tablet by mouth 1 day or 1 dose., Disp: 90 tablet, Rfl: 0 .  Melatonin 3 MG TABS, Take by mouth., Disp: , Rfl:  .  Multiple Vitamins-Minerals (ZINC PO), Take by mouth., Disp: , Rfl:  .  olmesartan-hydrochlorothiazide (BENICAR HCT) 20-12.5 MG tablet, TAKE 1 TABLET BY MOUTH DAILY., Disp: 90 tablet, Rfl: 1 .  Omega-3 Fatty Acids (OMEGA 3 PO), Take by mouth., Disp: , Rfl:    No Known Allergies   Review of Systems  Constitutional: Negative.   Respiratory: Negative.   Cardiovascular: Negative for chest pain, palpitations and leg swelling.  Neurological: Negative for headaches.  Psychiatric/Behavioral: Negative.  Today's Vitals   04/07/20 0935  BP: 138/86  Pulse: 71  Temp: 98 F (36.7 C)  Weight: (!) 330 lb (149.7 kg)  Height: 5' 4.8" (1.646 m)  PainSc: 0-No pain   Body mass index is 55.25 kg/m.   Objective:  Physical Exam Vitals reviewed.  Constitutional:      General: She is not in acute distress.    Appearance: Normal appearance.  Cardiovascular:     Rate and Rhythm: Normal rate and regular rhythm.     Pulses: Normal pulses.     Heart sounds: Normal heart sounds. No murmur heard.   Pulmonary:     Effort: Pulmonary effort is normal. No respiratory distress.      Breath sounds: Normal breath sounds.  Skin:    Capillary Refill: Capillary refill takes less than 2 seconds.  Neurological:     General: No focal deficit present.     Mental Status: She is alert and oriented to person, place, and time.     Cranial Nerves: No cranial nerve deficit.  Psychiatric:        Mood and Affect: Mood normal.        Behavior: Behavior normal.        Thought Content: Thought content normal.        Judgment: Judgment normal.         Assessment And Plan:     1. Essential hypertension  Chronic, fair control  Continue with current medications  2. Class 3 severe obesity due to excess calories without serious comorbidity with body mass index (BMI) of 50.0 to 59.9 in adult [facility])  Chronic  Discussed healthy diet and regular exercise options   Encouraged to exercise at least 150 minutes per week with 2 days of strength training  Talked with her in great detail of the risk of obesity as she has had approximately 8 lb weight gain since her last office visit.   She would benefit from medications to help with this and to have a nutrition consult  3. Screening mammogram, encounter for  She has not been for her mammogram at this time, order was placed in June     Patient was given opportunity to ask questions. Patient verbalized understanding of the plan and was able to repeat key elements of the plan. All questions were answered to their satisfaction.    Teola Bradley, FNP, have reviewed all documentation for this visit. The documentation on 04/11/20 for the exam, diagnosis, procedures, and orders are all accurate and complete.  THE PATIENT IS ENCOURAGED TO PRACTICE SOCIAL DISTANCING DUE TO THE COVID-19 PANDEMIC.

## 2020-05-03 MED FILL — OLMESARTAN-HCTZ 20-12.5 MG: 20-12.5 | 90 days supply | Qty: 90 | Fill #1

## 2020-07-28 ENCOUNTER — Encounter: Payer: Self-pay | Admitting: Nurse Practitioner

## 2020-08-25 ENCOUNTER — Other Ambulatory Visit: Payer: Self-pay | Admitting: Nurse Practitioner

## 2020-08-25 DIAGNOSIS — I10 Essential (primary) hypertension: Secondary | ICD-10-CM

## 2020-08-25 MED FILL — OLMESARTAN-HCTZ 20-12.5 MG: 20-12.5 | 90 days supply | Qty: 90 | Fill #0

## 2020-12-26 ENCOUNTER — Other Ambulatory Visit (HOSPITAL_COMMUNITY): Payer: Self-pay

## 2020-12-26 MED FILL — Olmesartan Medoxomil-Hydrochlorothiazide Tab 20-12.5 MG: ORAL | 30 days supply | Qty: 30 | Fill #0 | Status: AC

## 2020-12-29 ENCOUNTER — Other Ambulatory Visit (HOSPITAL_COMMUNITY): Payer: Self-pay

## 2020-12-30 ENCOUNTER — Other Ambulatory Visit (HOSPITAL_COMMUNITY): Payer: Self-pay

## 2020-12-31 ENCOUNTER — Other Ambulatory Visit (HOSPITAL_COMMUNITY): Payer: Self-pay

## 2021-01-02 ENCOUNTER — Other Ambulatory Visit (HOSPITAL_COMMUNITY): Payer: Self-pay

## 2021-01-06 ENCOUNTER — Other Ambulatory Visit: Payer: Self-pay | Admitting: Nurse Practitioner

## 2021-01-06 ENCOUNTER — Ambulatory Visit (INDEPENDENT_AMBULATORY_CARE_PROVIDER_SITE_OTHER): Payer: BLUE CROSS/BLUE SHIELD | Admitting: Nurse Practitioner

## 2021-01-06 ENCOUNTER — Other Ambulatory Visit (HOSPITAL_COMMUNITY): Payer: Self-pay

## 2021-01-06 ENCOUNTER — Encounter: Payer: Self-pay | Admitting: Nurse Practitioner

## 2021-01-06 ENCOUNTER — Other Ambulatory Visit: Payer: Self-pay

## 2021-01-06 VITALS — BP 128/82 | HR 79 | Temp 98.1°F | Ht 64.0 in | Wt 334.6 lb

## 2021-01-06 DIAGNOSIS — R252 Cramp and spasm: Secondary | ICD-10-CM | POA: Diagnosis not present

## 2021-01-06 DIAGNOSIS — I10 Essential (primary) hypertension: Secondary | ICD-10-CM | POA: Diagnosis not present

## 2021-01-06 DIAGNOSIS — Z789 Other specified health status: Secondary | ICD-10-CM

## 2021-01-06 DIAGNOSIS — Z6841 Body Mass Index (BMI) 40.0 and over, adult: Secondary | ICD-10-CM

## 2021-01-06 DIAGNOSIS — E559 Vitamin D deficiency, unspecified: Secondary | ICD-10-CM | POA: Diagnosis not present

## 2021-01-06 DIAGNOSIS — Z0000 Encounter for general adult medical examination without abnormal findings: Secondary | ICD-10-CM | POA: Diagnosis not present

## 2021-01-06 DIAGNOSIS — Z114 Encounter for screening for human immunodeficiency virus [HIV]: Secondary | ICD-10-CM

## 2021-01-06 DIAGNOSIS — R82998 Other abnormal findings in urine: Secondary | ICD-10-CM

## 2021-01-06 DIAGNOSIS — Z111 Encounter for screening for respiratory tuberculosis: Secondary | ICD-10-CM

## 2021-01-06 DIAGNOSIS — Z23 Encounter for immunization: Secondary | ICD-10-CM

## 2021-01-06 DIAGNOSIS — H6123 Impacted cerumen, bilateral: Secondary | ICD-10-CM | POA: Diagnosis not present

## 2021-01-06 LAB — POCT URINALYSIS DIPSTICK
Bilirubin, UA: NEGATIVE
Blood, UA: NEGATIVE
Glucose, UA: NEGATIVE
Ketones, UA: NEGATIVE
Nitrite, UA: NEGATIVE
Protein, UA: POSITIVE — AB
Spec Grav, UA: 1.025 (ref 1.010–1.025)
Urobilinogen, UA: 0.2 E.U./dL
pH, UA: 6.5 (ref 5.0–8.0)

## 2021-01-06 LAB — POCT UA - MICROALBUMIN
Creatinine, POC: 200 mg/dL
Microalbumin Ur, POC: 80 mg/L

## 2021-01-06 MED ORDER — CYCLOBENZAPRINE HCL 10 MG PO TABS
10.0000 mg | ORAL_TABLET | Freq: Three times a day (TID) | ORAL | 0 refills | Status: DC | PRN
  Filled 2021-01-06 – 2021-01-10 (×2): qty 30, 10d supply, fill #0

## 2021-01-06 NOTE — Patient Instructions (Signed)

## 2021-01-06 NOTE — Progress Notes (Signed)
[employer] as a Education administrator for [employer], NP.,have documented all relevant documentation on the behalf of [employer], NP,as directed by  Bary Castilla, NP while in the presence of Bary Castilla, NP.  This visit occurred during the SARS-CoV-2 public health emergency.  Safety protocols were in place, including screening questions prior to the visit, additional usage of staff PPE, and extensive cleaning of exam room while observing appropriate contact time as indicated for disinfecting solutions.  Subjective:     Patient ID: Donna Crawford , female    DOB: 21-Aug-1960 , 60 y.o.   MRN: [id number]   Chief Complaint  Patient presents with   Annual Exam    HPI  Pt is here for HM. She would like to get her Shingrix vaccine. She has not other questions or concerns at the moment. She does have a mammogram scheduled. She is going to make an appt with her OBGYN to get her pap smear done.  She is also here to get her immunization form filled out for her school.  She would like to get both of her ears cleaned. She gets yearly eye exams. She goes to the dentist. She admits she does not eat very healthy or exercises but she is trying to get back at it. She is trying to avoid eating fast and fatty foods. Will check her labs today.     Hypertension This is a chronic problem. The current episode started more than 1 year ago. The problem is controlled. Associated symptoms include peripheral edema. Pertinent negatives include no chest pain, headaches, palpitations or shortness of breath. (Contributed to being on her feet for 12 hrs per day and is relieved by elevation.) There are no associated agents to hypertension. Risk factors for coronary artery disease include sedentary lifestyle and obesity. Past treatments include diuretics and angiotensin blockers. The current treatment provides moderate improvement. There are no compliance problems.  There is no history of angina. There is no  history of chronic renal disease.    Past Medical History:  Diagnosis Date   H/O varicella    History of measles, mumps, or rubella    Hypertension    Non-atypical endometrial cells on cervical Pap smear 2008     Family History  Problem Relation Age of Onset   Diabetes Mother    Hypertension Mother    Cancer Father    Heart disease Father    Hypertension Father    Stroke Father    Hypertension Sister    Cancer Sister    Diabetes Brother    Hypertension Brother    Diabetes Sister    Hypertension Sister      Current Outpatient Medications:    Ascorbic Acid (VITAMIN C PO), Take by mouth., Disp: , Rfl:    Cholecalciferol (VITAMIN D) 50 MCG (2000 UT) tablet, Take 1 tablet (2,000 Units total) by mouth daily., Disp: 90 tablet, Rfl: 0   Cyanocobalamin (B-12 PO), Take by mouth., Disp: , Rfl:    glucosamine-chondroitin 500-400 MG tablet, Take 1 tablet by mouth 1 day or 1 dose., Disp: 90 tablet, Rfl: 0   Melatonin 3 MG TABS, Take by mouth., Disp: , Rfl:    Multiple Vitamins-Minerals (ZINC PO), Take by mouth., Disp: , Rfl:    olmesartan-hydrochlorothiazide (BENICAR HCT) 20-12.5 MG tablet, TAKE 1 TABLET BY MOUTH ONCE A DAY, Disp: 90 tablet, Rfl: 1   Omega-3 Fatty Acids (OMEGA 3 PO), Take by mouth., Disp: , Rfl:    cyclobenzaprine (FLEXERIL) 10 MG  tablet, Take 1 tablet (10 mg total) by mouth 3 (three) times daily as needed for muscle spasms., Disp: 30 tablet, Rfl: 0   No Known Allergies   Men's preventive visit. Patient Health Questionnaire (PHQ-2) is  [facility] Office Visit from 11/21/2019 in [facility]  PHQ-2 Total Score 0     . Patient is on a not diet. Marital status: Married. Relevant history for alcohol use is:  Social History   Substance and Sexual Activity  Alcohol Use Yes   Comment: Occasional wine  . Relevant history for tobacco use is: non Social History   Tobacco Use  Smoking Status Never  Smokeless Tobacco Never  .   Review of  Systems  Constitutional: Negative.  Negative for chills, fatigue and fever.  HENT:  Negative for congestion, rhinorrhea and sinus pain.   Eyes:  Negative for pain and visual disturbance.  Respiratory: Negative.  Negative for cough, shortness of breath and wheezing.   Cardiovascular: Negative.  Negative for chest pain and palpitations.  Gastrointestinal:  Negative for abdominal distention, constipation and diarrhea.  Endocrine: Negative for polydipsia, polyphagia and polyuria.  Musculoskeletal:  Negative for arthralgias and myalgias.  Neurological: Negative.  Negative for dizziness, weakness, numbness and headaches.  Psychiatric/Behavioral: Negative.  Negative for confusion.     Today's Vitals   01/06/21 1459  BP: 128/82  Pulse: 79  Temp: 98.1 F (36.7 C)  Weight: (!) 334 lb 9.6 oz (151.8 kg)  Height: _0  (1.626 m)   Body mass index is 57.43 kg/m.   Objective:  Physical Exam Vitals and nursing note reviewed.  Constitutional:      Appearance: Normal appearance. She is obese.  HENT:     Head: Normocephalic and atraumatic.     Right Ear: Tympanic membrane, ear canal and external ear normal. There is impacted cerumen.     Left Ear: Tympanic membrane, ear canal and external ear normal. There is impacted cerumen.     Nose: Nose normal. No congestion or rhinorrhea.     Mouth/Throat:     Mouth: Mucous membranes are moist.     Pharynx: Oropharynx is clear.  Eyes:     Extraocular Movements: Extraocular movements intact.     Conjunctiva/sclera: Conjunctivae normal.     Pupils: Pupils are equal, round, and reactive to light.  Cardiovascular:     Rate and Rhythm: Normal rate and regular rhythm.     Pulses: Normal pulses.     Heart sounds: Normal heart sounds. No murmur heard. Pulmonary:     Effort: Pulmonary effort is normal. No respiratory distress.     Breath sounds: Normal breath sounds. No wheezing.  Chest:     Comments: Deferred. Patient refused breast exam  Abdominal:      General: Abdomen is flat. Bowel sounds are normal.     Palpations: Abdomen is soft.  Genitourinary:    Comments: deferred Musculoskeletal:        General: Normal range of motion.     Cervical back: Normal range of motion and neck supple.  Skin:    General: Skin is warm and dry.     Capillary Refill: Capillary refill takes less than 2 seconds.  Neurological:     General: No focal deficit present.     Mental Status: She is alert and oriented to person, place, and time.  Psychiatric:        Mood and Affect: Mood normal.        Behavior: Behavior normal.  Assessment And Plan:    1. Encounter for annual health examination --Patient is here for their annual physical exam and we discussed any changes to medication and medical history.  -Behavior modification was discussed as well as diet and exercise history  -Patient will continue to exercise regularly and modify their diet.  -Recommendation for yearly physical annuals, immunization and screenings including mammogram and colonoscopy were discussed with the patient.  -Recommended intake of multivitamin, vitamin D and calcium.  -Individualized advise was given to the patient pertaining to their own health history in regards to diet, exercise, medical condition and referrals.  - Hemoglobin A1c - Lipid panel - CBC no Diff - HIV antibody (with reflex) - EKG 12-Lead  2. Essential hypertension -Limit the intake of processed foods and salt intake. You should increase your intake of green vegetables and fruits. Limit the use of alcohol. Limit fast foods and fried foods. Avoid high fatty saturated and trans fat foods. Keep yourself hydrated with drinking water. Avoid red meats. Eat lean meats instead. Exercise for atleast 30-45 min for atleast 4-5 times a week.  -Chronic, stable, continue meds  - CMP14+EGFR - CBC no Diff - POCT Urinalysis Dipstick (81002) - POCT UA - Microalbumin  3. Vitamin D deficiency -Will check and supplement if  needed. Advised patient to spend atleast 15 min. Daily in sunlight.  - Vitamin D (25 hydroxy)  4. Muscle cramps - cyclobenzaprine (FLEXERIL) 10 MG tablet; Take 1 tablet (10 mg total) by mouth 3 (three) times daily as needed for muscle spasms.  Dispense: 30 tablet; Refill: 0  5. Encounter for immunization - Tdap vaccine greater than or equal to 7yo IM  6. Encounter for screening for human immunodeficiency virus (HIV) - HIV antibody (with reflex)  7. Encounter for screening for respiratory tuberculosis - QuantiFERON-TB Gold Plus  8. Bilateral impacted cerumen -used curette in both ears.   9. Class 3 severe obesity due to excess calories without serious comorbidity with body mass index (BMI) of 50.0 to 59.9 in adult [facility]) Advised patient on a healthy diet including avoiding fast food and red meats. Increase the intake of lean meats including grilled chicken and [place].  Drink a lot of water. Decrease intake of fatty foods. Exercise for 30-45 min. 4-5 a week to decrease the risk of cardiac event.   10. Rubella immune status not known - Rubella Antibodies  11. Leukocytes in urine - Culture, Urine  12. Varicella vaccination status unknown - Varicella zoster antibody, IgG  The patient was encouraged to call or send a message through MyChart for any questions or concerns.   Follow up: if symptoms persist or do not get better.   Staying healthy and adopting a healthy lifestyle for your overall health is important. You should eat 7 or more servings of fruits and vegetables per day. You should drink plenty of water to keep yourself hydrated and your kidneys healthy. This includes about 65-80+ fluid ounces of water. Limit your intake of animal fats especially for elevated cholesterol. Avoid highly processed food and limit your salt intake if you have hypertension. Avoid foods high in saturated/Trans fats. Along with a healthy diet it is also very important to maintain time for yourself to  maintain a healthy mental health with low stress levels. You should get atleast 150 min of moderate intensity exercise weekly for a healthy heart. Along with eating right and exercising, aim for at least 7-9 hours of sleep daily.  Eat more whole grains which includes  barley, wheat berries, oats, Bulow rice and whole wheat pasta. Use healthy plant oils which include olive, soy, corn, sunflower and peanut. Limit your caffeine and sugary drinks. Limit your intake of fast foods. Limit milk and dairy products to one or two daily servings.   Patient was given opportunity to ask questions. Patient verbalized understanding of the plan and was able to repeat key elements of the plan. All questions were answered to their satisfaction.  Raman Cherry [place], DNP   I, Raman Reilly Blades have reviewed all documentation for this visit. The documentation on 01/06/21 for the exam, diagnosis, procedures, and orders are all accurate and complete.    THE PATIENT IS ENCOURAGED TO PRACTICE SOCIAL DISTANCING DUE TO THE COVID-19 PANDEMIC.

## 2021-01-07 LAB — VARICELLA ZOSTER ANTIBODY, IGG: Varicella zoster IgG: 2556 index (ref >165)

## 2021-01-07 LAB — QUANTIFERON-TB GOLD PLUS

## 2021-01-08 LAB — URINE CULTURE

## 2021-01-09 ENCOUNTER — Other Ambulatory Visit: Payer: Self-pay | Admitting: Nurse Practitioner

## 2021-01-09 DIAGNOSIS — A498 Other bacterial infections of unspecified site: Secondary | ICD-10-CM

## 2021-01-09 LAB — CMP14+EGFR
ALT: 13 IU/L (ref 0–32)
AST: 12 IU/L (ref 0–40)
Albumin/Globulin Ratio: 1.7 (ref 1.2–2.2)
Albumin: 4.7 g/dL (ref 3.8–4.9)
Alkaline Phosphatase: 67 IU/L (ref 44–121)
BUN/Creatinine Ratio: 9 (ref 9–23)
BUN: 9 mg/dL (ref 6–24)
Bilirubin Total: 0.3 mg/dL (ref 0.0–1.2)
CO2: 22 mmol/L (ref 20–29)
Calcium: 9.6 mg/dL (ref 8.7–10.2)
Chloride: 101 mmol/L (ref 96–106)
Creatinine, Ser: 1.04 mg/dL — ABNORMAL HIGH (ref 0.57–1.00)
Globulin, Total: 2.7 g/dL (ref 1.5–4.5)
Glucose: 92 mg/dL (ref 65–99)
Potassium: 3.8 mmol/L (ref 3.5–5.2)
Sodium: 138 mmol/L (ref 134–144)
Total Protein: 7.4 g/dL (ref 6.0–8.5)
eGFR: 62 mL/min/{1.73_m2} (ref >59)

## 2021-01-09 LAB — HEMOGLOBIN A1C
Est. average glucose Bld gHb Est-mCnc: 120 mg/dL
Hgb A1c MFr Bld: 5.8 % — ABNORMAL HIGH (ref 4.8–5.6)

## 2021-01-09 LAB — CBC
Hematocrit: 39 % (ref 34.0–46.6)
Hemoglobin: 12.7 g/dL (ref 11.1–15.9)
MCH: 27 pg (ref 26.6–33.0)
MCHC: 32.6 g/dL (ref 31.5–35.7)
MCV: 83 fL (ref 79–97)
Platelets: 353 10*3/uL (ref 150–450)
RBC: 4.71 x10E6/uL (ref 3.77–5.28)
RDW: 14.7 % (ref 11.7–15.4)
WBC: 5.9 10*3/uL (ref 3.4–10.8)

## 2021-01-09 LAB — LIPID PANEL
Chol/HDL Ratio: 5 ratio — ABNORMAL HIGH (ref 0.0–4.4)
Cholesterol, Total: 254 mg/dL — ABNORMAL HIGH (ref 100–199)
HDL: 51 mg/dL (ref >39)
LDL Chol Calc (NIH): 172 mg/dL — ABNORMAL HIGH (ref 0–99)
Triglycerides: 170 mg/dL — ABNORMAL HIGH (ref 0–149)
VLDL Cholesterol Cal: 31 mg/dL (ref 5–40)

## 2021-01-09 LAB — QUANTIFERON-TB GOLD PLUS

## 2021-01-09 LAB — VITAMIN D 25 HYDROXY (VIT D DEFICIENCY, FRACTURES): Vit D, 25-Hydroxy: 27.3 ng/mL — ABNORMAL LOW (ref 30.0–100.0)

## 2021-01-09 LAB — HIV ANTIBODY (ROUTINE TESTING W REFLEX): HIV Screen 4th Generation wRfx: NONREACTIVE

## 2021-01-09 LAB — RUBELLA SCREEN: Rubella Antibodies, IGG: 8.6 index (ref >0.99)

## 2021-01-09 MED ORDER — CIPROFLOXACIN HCL 250 MG PO TABS
250.0000 mg | ORAL_TABLET | Freq: Two times a day (BID) | ORAL | 0 refills | Status: AC
  Filled 2021-01-09: qty 6, 3d supply, fill #0

## 2021-01-10 ENCOUNTER — Other Ambulatory Visit (HOSPITAL_COMMUNITY): Payer: Self-pay

## 2021-01-12 ENCOUNTER — Other Ambulatory Visit (HOSPITAL_COMMUNITY): Payer: Self-pay

## 2021-01-14 ENCOUNTER — Other Ambulatory Visit (HOSPITAL_COMMUNITY): Payer: Self-pay

## 2021-01-19 ENCOUNTER — Other Ambulatory Visit (HOSPITAL_COMMUNITY): Payer: Self-pay

## 2021-02-12 ENCOUNTER — Other Ambulatory Visit (HOSPITAL_COMMUNITY): Payer: Self-pay

## 2021-02-12 MED FILL — Olmesartan Medoxomil-Hydrochlorothiazide Tab 20-12.5 MG: ORAL | 30 days supply | Qty: 30 | Fill #1 | Status: AC

## 2021-02-14 ENCOUNTER — Other Ambulatory Visit (HOSPITAL_COMMUNITY): Payer: Self-pay

## 2021-02-17 ENCOUNTER — Other Ambulatory Visit (HOSPITAL_COMMUNITY): Payer: Self-pay

## 2021-03-19 ENCOUNTER — Other Ambulatory Visit (HOSPITAL_COMMUNITY): Payer: Self-pay

## 2021-03-19 MED FILL — Olmesartan Medoxomil-Hydrochlorothiazide Tab 20-12.5 MG: ORAL | 30 days supply | Qty: 30 | Fill #2 | Status: AC

## 2021-05-12 ENCOUNTER — Other Ambulatory Visit (HOSPITAL_COMMUNITY): Payer: Self-pay

## 2021-05-12 ENCOUNTER — Other Ambulatory Visit: Payer: Self-pay | Admitting: Nurse Practitioner

## 2021-05-12 DIAGNOSIS — I10 Essential (primary) hypertension: Secondary | ICD-10-CM

## 2021-05-12 MED ORDER — OLMESARTAN MEDOXOMIL-HCTZ 20-12.5 MG PO TABS
1.0000 | ORAL_TABLET | Freq: Every day | ORAL | 1 refills | Status: DC
Start: 2021-05-12 — End: 2022-01-11
  Filled 2021-05-12: qty 30, 30d supply, fill #0
  Filled 2021-07-22: qty 30, 30d supply, fill #1
  Filled 2021-10-19: qty 30, 30d supply, fill #2
  Filled 2021-12-04: qty 30, 30d supply, fill #3

## 2021-06-24 ENCOUNTER — Other Ambulatory Visit (HOSPITAL_COMMUNITY): Payer: Self-pay

## 2021-06-24 ENCOUNTER — Other Ambulatory Visit: Payer: Self-pay

## 2021-06-24 ENCOUNTER — Encounter: Payer: Self-pay | Admitting: Nurse Practitioner

## 2021-06-24 ENCOUNTER — Ambulatory Visit (INDEPENDENT_AMBULATORY_CARE_PROVIDER_SITE_OTHER): Payer: BLUE CROSS/BLUE SHIELD | Admitting: Nurse Practitioner

## 2021-06-24 VITALS — Temp 97.9°F | Ht 64.0 in | Wt 320.0 lb

## 2021-06-24 DIAGNOSIS — R0981 Nasal congestion: Secondary | ICD-10-CM | POA: Diagnosis not present

## 2021-06-24 DIAGNOSIS — R051 Acute cough: Secondary | ICD-10-CM

## 2021-06-24 MED ORDER — CARESTART COVID-19 HOME TEST VI KIT
PACK | 0 refills | Status: DC
  Filled 2021-06-24: qty 4, 4d supply, fill #0

## 2021-06-24 MED ORDER — MOMETASONE FUROATE 50 MCG/ACT NA SUSP
2.0000 | Freq: Every day | NASAL | 2 refills | Status: DC
  Filled 2021-06-24 (×2): qty 17, 30d supply, fill #0

## 2021-06-24 MED ORDER — FLUTICASONE PROPIONATE 50 MCG/ACT NA SUSP
2.0000 | Freq: Every day | NASAL | 2 refills | Status: AC
  Filled 2021-06-24: qty 16, 30d supply, fill #0

## 2021-06-24 NOTE — Progress Notes (Signed)
Virtual Visit via My Chart Video   This visit type was conducted due to national recommendations for restrictions regarding the COVID-19 Pandemic (e.g. social distancing) in an effort to limit this patient's exposure and mitigate transmission in our community.  Due to her co-morbid illnesses, this patient is at least at moderate risk for complications without adequate follow up.  This format is felt to be most appropriate for this patient at this time.  All issues noted in this document were discussed and addressed.  A limited physical exam was performed with this format.    This visit type was conducted due to national recommendations for restrictions regarding the COVID-19 Pandemic (e.g. social distancing) in an effort to limit this patient's exposure and mitigate transmission in our community.  Patients identity confirmed using two different identifiers.  This format is felt to be most appropriate for this patient at this time.  All issues noted in this document were discussed and addressed.  No physical exam was performed (except for noted visual exam findings with Video Visits).    Date:  06/24/2021   ID:  Donna Crawford, DOB 1960-11-09, MRN [id number]  Patient Location:  Home - spoke with Donna Crawford  Provider location:   Office    Chief Complaint:  cold symptoms  History of Present Illness:    Donna Crawford is a 61 y.o. female who presents via video conferencing for a telehealth visit today.    The patient does have symptoms concerning for COVID-19 infection (fever, chills, cough, or new shortness of breath).   Cough This is a new problem. Episode onset: 3 days. The cough is Productive of Schoenfelder sputum (yellow mucous). Associated symptoms include nasal congestion. Pertinent negatives include no chest pain, chills, ear congestion, fever, headaches, postnasal drip, shortness of breath or wheezing. Associated symptoms comments: Had a sore throat the first day. Nothing aggravates  the symptoms. Treatments tried: mucinex, nyquil and dayquil. The treatment provided mild relief. There is no history of asthma, bronchiectasis or bronchitis.    Past Medical History:  Diagnosis Date   H/O varicella    History of measles, mumps, or rubella    Hypertension    Non-atypical endometrial cells on cervical Pap smear 2008   Past Surgical History:  Procedure Laterality Date   CHOLECYSTECTOMY     COLONOSCOPY N/A 01/26/2013   Procedure: COLONOSCOPY;  Surgeon: Beryle Beams, MD;  Location: [facility];  Service: Endoscopy;  Laterality: N/A;   gall bladder removed  2006     Current Meds  Medication Sig   Ascorbic Acid (VITAMIN C PO) Take by mouth.   Cholecalciferol (VITAMIN D) 50 MCG (2000 UT) tablet Take 1 tablet (2,000 Units total) by mouth daily.   COVID-19 At Home Antigen Test [facility] COVID-19 AG HOME TEST) KIT Use as directed.   Cyanocobalamin (B-12 PO) Take by mouth.   cyclobenzaprine (FLEXERIL) 10 MG tablet Take 1 tablet (10 mg total) by mouth 3 (three) times daily as needed for muscle spasms.   glucosamine-chondroitin 500-400 MG tablet Take 1 tablet by mouth 1 day or 1 dose.   Melatonin 3 MG TABS Take by mouth.   mometasone (NASONEX) 50 MCG/ACT nasal spray Place 2 sprays into the nose daily.   Multiple Vitamins-Minerals (ZINC PO) Take by mouth.   olmesartan-hydrochlorothiazide (BENICAR HCT) 20-12.5 MG tablet TAKE 1 TABLET BY MOUTH ONCE A DAY   Omega-3 Fatty Acids (OMEGA 3 PO) Take by mouth.     Allergies:   Patient  has no known allergies.   Social History   Tobacco Use   Smoking status: Never   Smokeless tobacco: Never  Substance Use Topics   Alcohol use: Yes    Comment: Occasional wine   Drug use: No     Family Hx: The patient's family history includes Cancer in her father and sister; Diabetes in her brother, mother, and sister; Heart disease in her father; Hypertension in her brother, father, mother, sister, and sister; Stroke in her father.  ROS:    Please see the history of present illness.    Review of Systems  Constitutional:  Negative for chills, fever and malaise/fatigue.  HENT:  Negative for postnasal drip.   Respiratory:  Positive for cough. Negative for shortness of breath and wheezing.   Cardiovascular:  Negative for chest pain.  Neurological:  Negative for dizziness and headaches.  Psychiatric/Behavioral:  Negative for depression.    All other systems reviewed and are negative.   Labs/Other Tests and Data Reviewed:    Recent Labs: 01/06/2021: ALT 13; BUN 9; Creatinine, Ser 1.04; Hemoglobin 12.7; Platelets 353; Potassium 3.8; Sodium 138   Recent Lipid Panel Lab Results  Component Value Date/Time   CHOL 254 (H) 01/06/2021 05:23 PM   TRIG 170 (H) 01/06/2021 05:23 PM   HDL 51 01/06/2021 05:23 PM   CHOLHDL 5.0 (H) 01/06/2021 05:23 PM   LDLCALC 172 (H) 01/06/2021 05:23 PM    Wt Readings from Last 3 Encounters:  06/24/21 (!) 320 lb (145.2 kg)  01/06/21 (!) 334 lb 9.6 oz (151.8 kg)  04/07/20 (!) 330 lb (149.7 kg)     Exam:    Vital Signs:  Temp 97.9 F (36.6 C)    Ht $R'5\' 4"'[place]$  (1.626 m)    Wt (!) 320 lb (145.2 kg)    LMP 12/03/2012    BMI 54.93 kg/m     Physical Exam Vitals reviewed.  Constitutional:      General: She is not in acute distress.    Appearance: Normal appearance.  Pulmonary:     Effort: Pulmonary effort is normal. No respiratory distress.     Breath sounds: No wheezing.  Neurological:     General: No focal deficit present.     Mental Status: She is alert and oriented to person, place, and time. Mental status is at baseline.     Cranial Nerves: No cranial nerve deficit.     Motor: No weakness.  Psychiatric:        Mood and Affect: Mood and affect normal.        Behavior: Behavior normal.        Thought Content: Thought content normal.        Cognition and Memory: Memory normal.        Judgment: Judgment normal.    ASSESSMENT & PLAN:    1. Acute cough Advised patient to take Vitamin C, D,  Zinc.  Keep yourself hydrated with a lot of water and rest. Take Delsym for cough and Mucinex as need. Take Tylenol or pain reliever every 4-6 hours as needed for pain/fever/body ache. If you have elevated blood pressure, you can take OTC Corcidin. You can also take OTC oscillococcinum to help with your symptoms.  Educated patient if symptoms get worse or if she experiences any SOB, chest pain or pain in her legs to seek immediate emergency care. Continue to monitor your oxygen levels. Call us if you have any questions.   - COVID-19 At Home Antigen Test [facility] COVID-19  AG HOME TEST) KIT; Use as directed.  Dispense: 4 kit; Refill: 0 - mometasone (NASONEX) 50 MCG/ACT nasal spray; Place 2 sprays into the nose daily.  Dispense: 17 g; Refill: 2  2. Nasal congestion Encouraged to use nasonex as needed and avoid dairy  Stay well hydrated - COVID-19 At Home Antigen Test [facility] COVID-19 AG HOME TEST) KIT; Use as directed.  Dispense: 4 kit; Refill: 0 - mometasone (NASONEX) 50 MCG/ACT nasal spray; Place 2 sprays into the nose daily.  Dispense: 17 g; Refill: 2    COVID-19 Education: The signs and symptoms of COVID-19 were discussed with the patient and how to seek care for testing (follow up with PCP or arrange E-visit).  The importance of social distancing was discussed today.  Patient Risk:   After full review of this patients clinical status, I feel that they are at least moderate risk at this time.  Time:   Today, I have spent 12.50 minutes/ seconds with the patient with telehealth technology discussing above diagnoses.     Medication Adjustments/Labs and Tests Ordered: Current medicines are reviewed at length with the patient today.  Concerns regarding medicines are outlined above.   Tests Ordered: No orders of the defined types were placed in this encounter.   Medication Changes: Meds ordered this encounter  Medications   COVID-19 At Home Antigen Test [facility] COVID-19 AG HOME TEST)  KIT    Sig: Use as directed.    Dispense:  4 kit    Refill:  0   mometasone (NASONEX) 50 MCG/ACT nasal spray    Sig: Place 2 sprays into the nose daily.    Dispense:  17 g    Refill:  2    Disposition:  Follow up prn  Signed, Minette Brine, FNP

## 2021-07-01 ENCOUNTER — Other Ambulatory Visit: Payer: Self-pay | Admitting: Nurse Practitioner

## 2021-07-01 ENCOUNTER — Other Ambulatory Visit (HOSPITAL_COMMUNITY): Payer: Self-pay

## 2021-07-01 DIAGNOSIS — R051 Acute cough: Secondary | ICD-10-CM

## 2021-07-01 DIAGNOSIS — R0981 Nasal congestion: Secondary | ICD-10-CM

## 2021-07-01 DIAGNOSIS — R252 Cramp and spasm: Secondary | ICD-10-CM

## 2021-07-01 NOTE — Telephone Encounter (Signed)
She needs a office visit.

## 2021-07-02 ENCOUNTER — Other Ambulatory Visit (HOSPITAL_COMMUNITY): Payer: Self-pay

## 2021-07-02 MED ORDER — CYCLOBENZAPRINE HCL 10 MG PO TABS
10.0000 mg | ORAL_TABLET | Freq: Three times a day (TID) | ORAL | 0 refills | Status: DC | PRN
  Filled 2021-07-02: qty 30, 10d supply, fill #0

## 2021-07-02 MED ORDER — CARESTART COVID-19 HOME TEST VI KIT
PACK | 0 refills | Status: DC
  Filled 2021-07-02: qty 4, 4d supply, fill #0

## 2021-07-02 MED ORDER — VITAMIN D 50 MCG (2000 UT) PO TABS
2000.0000 [IU] | ORAL_TABLET | Freq: Every day | ORAL | 0 refills | Status: AC
  Filled 2021-07-02: qty 90, 90d supply, fill #0

## 2021-07-03 ENCOUNTER — Other Ambulatory Visit (HOSPITAL_COMMUNITY): Payer: Self-pay

## 2021-07-04 ENCOUNTER — Other Ambulatory Visit (HOSPITAL_COMMUNITY): Payer: Self-pay

## 2021-07-05 ENCOUNTER — Encounter: Payer: Self-pay | Admitting: Nurse Practitioner

## 2021-07-07 ENCOUNTER — Other Ambulatory Visit: Payer: Self-pay | Admitting: Nurse Practitioner

## 2021-07-07 DIAGNOSIS — R051 Acute cough: Secondary | ICD-10-CM

## 2021-07-08 ENCOUNTER — Other Ambulatory Visit: Payer: Self-pay

## 2021-07-08 ENCOUNTER — Ambulatory Visit
Admission: RE | Admit: 2021-07-08 | Discharge: 2021-07-08 | Disposition: A | Discharge status: Home / Self Care | Payer: Self-pay | Source: Ambulatory Visit | Attending: Nurse Practitioner | Admitting: Nurse Practitioner

## 2021-07-08 DIAGNOSIS — R051 Acute cough: Secondary | ICD-10-CM

## 2021-07-22 ENCOUNTER — Other Ambulatory Visit (HOSPITAL_COMMUNITY): Payer: Self-pay

## 2021-09-19 ENCOUNTER — Other Ambulatory Visit (HOSPITAL_COMMUNITY): Payer: Self-pay

## 2021-10-19 ENCOUNTER — Other Ambulatory Visit (HOSPITAL_COMMUNITY): Payer: Self-pay

## 2021-12-04 ENCOUNTER — Other Ambulatory Visit (HOSPITAL_COMMUNITY): Payer: Self-pay

## 2022-01-11 ENCOUNTER — Other Ambulatory Visit (HOSPITAL_COMMUNITY): Payer: Self-pay

## 2022-01-11 ENCOUNTER — Encounter: Payer: Self-pay | Admitting: Internal Medicine

## 2022-01-11 ENCOUNTER — Other Ambulatory Visit (HOSPITAL_COMMUNITY)
Admission: RE | Admit: 2022-01-11 | Discharge: 2022-01-11 | Disposition: A | Discharge status: Home / Self Care | Payer: BLUE CROSS/BLUE SHIELD | Source: Ambulatory Visit | Attending: Internal Medicine | Admitting: Internal Medicine

## 2022-01-11 ENCOUNTER — Ambulatory Visit (INDEPENDENT_AMBULATORY_CARE_PROVIDER_SITE_OTHER): Payer: Self-pay | Admitting: Internal Medicine

## 2022-01-11 VITALS — BP 120/80 | HR 90 | Temp 98.3°F | Ht 65.0 in | Wt 329.4 lb

## 2022-01-11 DIAGNOSIS — Z01419 Encounter for gynecological examination (general) (routine) without abnormal findings: Secondary | ICD-10-CM | POA: Insufficient documentation

## 2022-01-11 DIAGNOSIS — Z1231 Encounter for screening mammogram for malignant neoplasm of breast: Secondary | ICD-10-CM

## 2022-01-11 DIAGNOSIS — Z8249 Family history of ischemic heart disease and other diseases of the circulatory system: Secondary | ICD-10-CM

## 2022-01-11 DIAGNOSIS — I10 Essential (primary) hypertension: Secondary | ICD-10-CM

## 2022-01-11 DIAGNOSIS — Z6841 Body Mass Index (BMI) 40.0 and over, adult: Secondary | ICD-10-CM

## 2022-01-11 DIAGNOSIS — Z0000 Encounter for general adult medical examination without abnormal findings: Secondary | ICD-10-CM

## 2022-01-11 DIAGNOSIS — Z23 Encounter for immunization: Secondary | ICD-10-CM

## 2022-01-11 LAB — POC HEMOCCULT BLD/STL (OFFICE/1-CARD/DIAGNOSTIC): Fecal Occult Blood, POC: NEGATIVE

## 2022-01-11 LAB — POCT URINALYSIS DIPSTICK
Bilirubin, UA: NEGATIVE
Glucose, UA: NEGATIVE
Ketones, UA: NEGATIVE
Leukocytes, UA: NEGATIVE
Nitrite, UA: NEGATIVE
Protein, UA: NEGATIVE
Spec Grav, UA: 1.02 (ref 1.010–1.025)
Urobilinogen, UA: 0.2 E.U./dL
pH, UA: 5 (ref 5.0–8.0)

## 2022-01-11 MED ORDER — OLMESARTAN MEDOXOMIL-HCTZ 20-12.5 MG PO TABS
1.0000 | ORAL_TABLET | Freq: Every day | ORAL | 1 refills | Status: DC
  Filled 2022-01-11: qty 90, 90d supply, fill #0
  Filled 2022-01-18: qty 30, 30d supply, fill #0
  Filled 2022-03-08: qty 30, 30d supply, fill #1
  Filled 2022-04-17: qty 30, 30d supply, fill #2
  Filled 2022-06-23: qty 30, 30d supply, fill #3
  Filled 2022-08-24: qty 30, 30d supply, fill #4

## 2022-01-11 NOTE — Patient Instructions (Addendum)
The 10-year ASCVD risk score (Arnett DK, et al., 2019) is: 6.9%   Values used to calculate the score:     Age: 61 years     Sex: Female     Is Non-Hispanic African American: Yes     Diabetic: No     Tobacco smoker: No     Systolic Blood Pressure: 782 mmHg     Is BP treated: Yes     HDL Cholesterol: 51 mg/dL     Total Cholesterol: 254 mg/dL   Health Maintenance, Female Adopting a healthy lifestyle and getting preventive care are important in promoting health and wellness. Ask your health care provider about: The right schedule for you to have regular tests and exams. Things you can do on your own to prevent diseases and keep yourself healthy. What should I know about diet, weight, and exercise? Eat a healthy diet  Eat a diet that includes plenty of vegetables, fruits, low-fat dairy products, and lean protein. Do not eat a lot of foods that are high in solid fats, added sugars, or sodium. Maintain a healthy weight Body mass index (BMI) is used to identify weight problems. It estimates body fat based on height and weight. Your health care provider can help determine your BMI and help you achieve or maintain a healthy weight. Get regular exercise Get regular exercise. This is one of the most important things you can do for your health. Most adults should: Exercise for at least 150 minutes each week. The exercise should increase your heart rate and make you sweat (moderate-intensity exercise). Do strengthening exercises at least twice a week. This is in addition to the moderate-intensity exercise. Spend less time sitting. Even light physical activity can be beneficial. Watch cholesterol and blood lipids Have your blood tested for lipids and cholesterol at 59 years of age, then have this test every 5 years. Have your cholesterol levels checked more often if: Your lipid or cholesterol levels are high. You are older than 59 years of age. You are at high risk for heart disease. What should  I know about cancer screening? Depending on your health history and family history, you may need to have cancer screening at various ages. This may include screening for: Breast cancer. Cervical cancer. Colorectal cancer. Skin cancer. Lung cancer. What should I know about heart disease, diabetes, and high blood pressure? Blood pressure and heart disease High blood pressure causes heart disease and increases the risk of stroke. This is more likely to develop in people who have high blood pressure readings or are overweight. Have your blood pressure checked: Every 3-5 years if you are 60-40 years of age. Every year if you are 69 years old or older. Diabetes Have regular diabetes screenings. This checks your fasting blood sugar level. Have the screening done: Once every three years after age 71 if you are at a normal weight and have a low risk for diabetes. More often and at a younger age if you are overweight or have a high risk for diabetes. What should I know about preventing infection? Hepatitis B If you have a higher risk for hepatitis B, you should be screened for this virus. Talk with your health care provider to find out if you are at risk for hepatitis B infection. Hepatitis C Testing is recommended for: Everyone born from [birth year] through 1963. Anyone with known risk factors for hepatitis C. Sexually transmitted infections (STIs) Get screened for STIs, including gonorrhea and chlamydia, if: You are sexually active  and are younger than 59 years of age. You are older than 59 years of age and your health care provider tells you that you are at risk for this type of infection. Your sexual activity has changed since you were last screened, and you are at increased risk for chlamydia or gonorrhea. Ask your health care provider if you are at risk. Ask your health care provider about whether you are at high risk for HIV. Your health care provider may recommend a prescription medicine to help  prevent HIV infection. If you choose to take medicine to prevent HIV, you should first get tested for HIV. You should then be tested every 3 months for as long as you are taking the medicine. Pregnancy If you are about to stop having your period (premenopausal) and you may become pregnant, seek counseling before you get pregnant. Take 400 to 800 micrograms (mcg) of folic acid every day if you become pregnant. Ask for birth control (contraception) if you want to prevent pregnancy. Osteoporosis and menopause Osteoporosis is a disease in which the bones lose minerals and strength with aging. This can result in bone fractures. If you are 48 years old or older, or if you are at risk for osteoporosis and fractures, ask your health care provider if you should: Be screened for bone loss. Take a calcium or vitamin D supplement to lower your risk of fractures. Be given hormone replacement therapy (HRT) to treat symptoms of menopause. Follow these instructions at home: Alcohol use Do not drink alcohol if: Your health care provider tells you not to drink. You are pregnant, may be pregnant, or are planning to become pregnant. If you drink alcohol: Limit how much you have to: 0-1 drink a day. Know how much alcohol is in your drink. In the [place] one drink equals one 12 oz bottle of beer (355 mL), one 5 oz glass of wine (148 mL), or one 1 oz glass of hard liquor (44 mL). Lifestyle Do not use any products that contain nicotine or tobacco. These products include cigarettes, chewing tobacco, and vaping devices, such as e-cigarettes. If you need help quitting, ask your health care provider. Do not use street drugs. Do not share needles. Ask your health care provider for help if you need support or information about quitting drugs. General instructions Schedule regular health, dental, and eye exams. Stay current with your vaccines. Tell your health care provider if: You often feel depressed. You have ever  been abused or do not feel safe at home. Summary Adopting a healthy lifestyle and getting preventive care are important in promoting health and wellness. Follow your health care provider's instructions about healthy diet, exercising, and getting tested or screened for diseases. Follow your health care provider's instructions on monitoring your cholesterol and blood pressure. This information is not intended to replace advice given to you by your health care provider. Make sure you discuss any questions you have with your health care provider. Document Revised: 10/27/2020 Document Reviewed: 10/27/2020 Elsevier Patient Education  [place].

## 2022-01-11 NOTE — Progress Notes (Signed)
Rich Brave Llittleton,acting as a Education administrator for [name], MD.,have documented all relevant documentation on the behalf of [name], MD,as directed by  [name], MD while in the presence of [name], MD.   Subjective:     Patient ID: Donna Crawford , female    DOB: Oct 31, 1960 , 61 y.o.   MRN: [id number]   Chief Complaint  Patient presents with   Annual Exam    HPI  Pt is here for HM. She is not currently followed by GYN. She can't recall when she was last seen by [facility]. She reports compliance with meds. Reports she has recently started water aerobics at the [place]. States she loves it!   Hypertension This is a chronic problem. The current episode started more than 1 year ago. The problem is controlled. Associated symptoms include peripheral edema. (Contributed to being on her feet for 12 hrs per day and is relieved by elevation.) There are no associated agents to hypertension. Risk factors for coronary artery disease include sedentary lifestyle and obesity. Past treatments include diuretics and angiotensin blockers. The current treatment provides moderate improvement. There are no compliance problems.  There is no history of angina. There is no history of chronic renal disease.     Past Medical History:  Diagnosis Date   H/O varicella    History of measles, mumps, or rubella    Hypertension    Non-atypical endometrial cells on cervical Pap smear 2008     Family History  Problem Relation Age of Onset   Diabetes Mother    Hypertension Mother    Cancer Father    Heart disease Father    Hypertension Father    Stroke Father    Hypertension Sister    Cancer Sister    Diabetes Brother    Hypertension Brother    Diabetes Sister    Hypertension Sister      Current Outpatient Medications:    Ascorbic Acid (VITAMIN C PO), Take by mouth., Disp: , Rfl:    Cholecalciferol (VITAMIN D) 50 MCG (2000 UT) tablet, Take 1 tablet (2,000 Units total) by mouth  daily., Disp: 90 tablet, Rfl: 0   Cyanocobalamin (B-12 PO), Take by mouth., Disp: , Rfl:    cyclobenzaprine (FLEXERIL) 10 MG tablet, Take 1 tablet (10 mg total) by mouth 3 (three) times daily as needed for muscle spasms., Disp: 30 tablet, Rfl: 0   Melatonin 3 MG TABS, Take by mouth., Disp: , Rfl:    Multiple Vitamins-Minerals (ZINC PO), Take by mouth., Disp: , Rfl:    Omega-3 Fatty Acids (OMEGA 3 PO), Take by mouth., Disp: , Rfl:    fluticasone (FLONASE) 50 MCG/ACT nasal spray, Place 2 sprays into both nostrils daily. (Patient not taking: Reported on 01/11/2022), Disp: 16 g, Rfl: 2   glucosamine-chondroitin 500-400 MG tablet, Take 1 tablet by mouth 1 day or 1 dose. (Patient not taking: Reported on 01/11/2022), Disp: 90 tablet, Rfl: 0   olmesartan-hydrochlorothiazide (BENICAR HCT) 20-12.5 MG tablet, Take 1 tablet by mouth daily., Disp: 90 tablet, Rfl: 1   No Known Allergies    The patient states she uses post menopausal status for birth control. Last LMP was Patient's last menstrual period was 12/03/2012.. Negative for Dysmenorrhea. Negative for: breast discharge, breast lump(s), breast pain and breast self exam. Associated symptoms include abnormal vaginal bleeding. Pertinent negatives include abnormal bleeding (hematology), anxiety, decreased libido, depression, difficulty falling sleep, dyspareunia, history of infertility, nocturia, sexual dysfunction, sleep disturbances,  urinary incontinence, urinary urgency, vaginal discharge and vaginal itching. Diet regular.The patient states her exercise level is  moderate.  . The patient's tobacco use is:  Social History   Tobacco Use  Smoking Status Never  Smokeless Tobacco Never  . She has been exposed to passive smoke. The patient's alcohol use is:  Social History   Substance and Sexual Activity  Alcohol Use Yes   Comment: Occasional wine   Review of Systems  Constitutional: Negative.   HENT: Negative.    Eyes: Negative.   Respiratory:  Negative.    Cardiovascular: Negative.   Gastrointestinal: Negative.   Endocrine: Negative.   Genitourinary: Negative.   Musculoskeletal: Negative.   Skin: Negative.   Allergic/Immunologic: Negative.   Neurological: Negative.   Hematological: Negative.   Psychiatric/Behavioral: Negative.       Today's Vitals   01/11/22 1452  BP: 120/80  Pulse: 90  Temp: 98.3 F (36.8 C)  Weight: (!) 329 lb 6.4 oz (149.4 kg)  Height: $Remove'5\' 5"'UoNSQql$  (1.651 m)  PainSc: 0-No pain   Body mass index is 54.82 kg/m.  Wt Readings from Last 3 Encounters:  01/11/22 (!) 329 lb 6.4 oz (149.4 kg)  06/24/21 (!) 320 lb (145.2 kg)  01/06/21 (!) 334 lb 9.6 oz (151.8 kg)     Objective:  Physical Exam Vitals and nursing note reviewed. Exam conducted with a chaperone present.  Constitutional:      Appearance: Normal appearance. She is obese.  HENT:     Head: Normocephalic and atraumatic.     Right Ear: Tympanic membrane, ear canal and external ear normal.     Left Ear: Tympanic membrane, ear canal and external ear normal.     Nose: Nose normal.     Mouth/Throat:     Mouth: Mucous membranes are moist.     Pharynx: Oropharynx is clear.  Eyes:     Extraocular Movements: Extraocular movements intact.     Conjunctiva/sclera: Conjunctivae normal.     Pupils: Pupils are equal, round, and reactive to light.  Cardiovascular:     Rate and Rhythm: Normal rate and regular rhythm.     Pulses: Normal pulses.     Heart sounds: Normal heart sounds.  Pulmonary:     Effort: Pulmonary effort is normal.     Breath sounds: Normal breath sounds.  Chest:  Breasts:    Tanner Score is 5.     Right: Normal.     Left: Normal.  Abdominal:     General: Bowel sounds are normal.     Palpations: Abdomen is soft.     Comments: Obese, soft. Difficult to assess organomegaly due to body habitus.   Genitourinary:    Exam position: Lithotomy position.     Tanner stage (genital): 5.     Labia:        Right: No lesion.        Left:  No tenderness or lesion.      Vagina: Normal.     Cervix: Erythema present.     Uterus: Normal.      Rectum: Normal. Guaiac result negative. No mass.     Comments: Difficult exam due to patient discomfort Musculoskeletal:        General: Normal range of motion.     Cervical back: Normal range of motion and neck supple.  Lymphadenopathy:     Lower Body: No right inguinal adenopathy. No left inguinal adenopathy.  Skin:    General: Skin is warm and dry.  Neurological:  General: No focal deficit present.     Mental Status: She is alert and oriented to person, place, and time.  Psychiatric:        Mood and Affect: Mood normal.        Behavior: Behavior normal.      Assessment And Plan:     1. Encounter for general adult medical examination w/o abnormal findings Comments: A full exam was performed. Importance of monthly self breast exams was discussed with the patient. We discussed importance of compliance w/ screening. PATIENT IS ADVISED TO GET 30-45 MINUTES REGULAR EXERCISE NO LESS THAN FOUR TO FIVE DAYS PER WEEK - BOTH WEIGHTBEARING EXERCISES AND AEROBIC ARE RECOMMENDED.  PATIENT IS ADVISED TO FOLLOW A HEALTHY DIET WITH AT LEAST SIX FRUITS/VEGGIES PER DAY, DECREASE INTAKE OF RED MEAT, AND TO INCREASE FISH INTAKE TO TWO DAYS PER WEEK.  MEATS/FISH SHOULD NOT BE FRIED, BAKED OR BROILED IS PREFERABLE.  IT IS ALSO IMPORTANT TO CUT BACK ON YOUR SUGAR INTAKE. PLEASE AVOID ANYTHING WITH ADDED SUGAR, CORN SYRUP OR OTHER SWEETENERS. IF YOU MUST USE A SWEETENER, YOU CAN TRY STEVIA. IT IS ALSO IMPORTANT TO AVOID ARTIFICIALLY SWEETENERS AND DIET BEVERAGES. LASTLY, I SUGGEST WEARING SPF 50 SUNSCREEN ON EXPOSED PARTS AND ESPECIALLY WHEN IN THE DIRECT SUNLIGHT FOR AN EXTENDED PERIOD OF TIME.  PLEASE AVOID FAST FOOD RESTAURANTS AND INCREASE YOUR WATER INTAKE. - CBC - CMP14+EGFR - Lipid panel - Hemoglobin A1c  2. Pap smear, low-risk Comments: Pap smear performed. Stool is heme negative.  - Cytology  -Pap Smear - POC Hemoccult Bld/Stl (1-Cd Office Dx)  3. Essential hypertension Comments: Chronic, well controlled. EKG performed, NSR w/ low voltage poss. pulmonary disease. She will rto in six months for re-evaluation. - POCT Urinalysis Dipstick (81002) - Microalbumin / Creatinine Urine Ratio - EKG 12-Lead - olmesartan-hydrochlorothiazide (BENICAR HCT) 20-12.5 MG tablet; Take 1 tablet by mouth daily.  Dispense: 90 tablet; Refill: 1  4. Class 3 severe obesity due to excess calories without serious comorbidity with body mass index (BMI) of 50.0 to 59.9 in adult [facility]) Comments: We discussed the use of Wegovy for obesity. Wants to see if her new insurance will cover, will submit info. Will f/u at that time.   5. Immunization due - Varicella-zoster vaccine IM  6. Encounter for screening mammogram for malignant neoplasm of breast - MM DIGITAL SCREENING BILATERAL; Future  7. Family history of heart disease Comments: agrees to coronary calcium scoring - CT CARDIAC SCORING; Future  Patient was given opportunity to ask questions. Patient verbalized understanding of the plan and was able to repeat key elements of the plan. All questions were answered to their satisfaction.   I, [name], MD, have reviewed all documentation for this visit. The documentation on 01/11/22 for the exam, diagnosis, procedures, and orders are all accurate and complete.   THE PATIENT IS ENCOURAGED TO PRACTICE SOCIAL DISTANCING DUE TO THE COVID-19 PANDEMIC.

## 2022-01-12 LAB — CBC
Hematocrit: 37.9 % (ref 34.0–46.6)
Hemoglobin: 12.4 g/dL (ref 11.1–15.9)
MCH: 26.8 pg (ref 26.6–33.0)
MCHC: 32.7 g/dL (ref 31.5–35.7)
MCV: 82 fL (ref 79–97)
Platelets: 339 10*3/uL (ref 150–450)
RBC: 4.62 x10E6/uL (ref 3.77–5.28)
RDW: 14.8 % (ref 11.7–15.4)
WBC: 6.5 10*3/uL (ref 3.4–10.8)

## 2022-01-12 LAB — HEMOGLOBIN A1C
Est. average glucose Bld gHb Est-mCnc: 120 mg/dL
Hgb A1c MFr Bld: 5.8 % — ABNORMAL HIGH (ref 4.8–5.6)

## 2022-01-12 LAB — CMP14+EGFR
ALT: 12 IU/L (ref 0–32)
AST: 13 IU/L (ref 0–40)
Albumin/Globulin Ratio: 1.7 (ref 1.2–2.2)
Albumin: 4.3 g/dL (ref 3.8–4.9)
Alkaline Phosphatase: 68 IU/L (ref 44–121)
BUN/Creatinine Ratio: 6 — ABNORMAL LOW (ref 9–23)
BUN: 6 mg/dL (ref 6–24)
Bilirubin Total: 0.3 mg/dL (ref 0.0–1.2)
CO2: 25 mmol/L (ref 20–29)
Calcium: 9.4 mg/dL (ref 8.7–10.2)
Chloride: 103 mmol/L (ref 96–106)
Creatinine, Ser: 1 mg/dL (ref 0.57–1.00)
Globulin, Total: 2.6 g/dL (ref 1.5–4.5)
Glucose: 88 mg/dL (ref 70–99)
Potassium: 4.3 mmol/L (ref 3.5–5.2)
Sodium: 141 mmol/L (ref 134–144)
Total Protein: 6.9 g/dL (ref 6.0–8.5)
eGFR: 65 mL/min/{1.73_m2} (ref >59)

## 2022-01-12 LAB — LIPID PANEL
Chol/HDL Ratio: 4.6 ratio — ABNORMAL HIGH (ref 0.0–4.4)
Cholesterol, Total: 215 mg/dL — ABNORMAL HIGH (ref 100–199)
HDL: 47 mg/dL (ref >39)
LDL Chol Calc (NIH): 141 mg/dL — ABNORMAL HIGH (ref 0–99)
Triglycerides: 150 mg/dL — ABNORMAL HIGH (ref 0–149)
VLDL Cholesterol Cal: 27 mg/dL (ref 5–40)

## 2022-01-12 LAB — MICROALBUMIN / CREATININE URINE RATIO
Creatinine, Urine: 88.3 mg/dL
Microalb/Creat Ratio: 6 mg/g creat (ref 0–29)
Microalbumin, Urine: 4.9 ug/mL

## 2022-01-15 LAB — CYTOLOGY - PAP: Diagnosis: NEGATIVE

## 2022-01-18 ENCOUNTER — Other Ambulatory Visit (HOSPITAL_COMMUNITY): Payer: Self-pay

## 2022-02-01 ENCOUNTER — Ambulatory Visit (HOSPITAL_BASED_OUTPATIENT_CLINIC_OR_DEPARTMENT_OTHER): Payer: 59

## 2022-03-08 ENCOUNTER — Other Ambulatory Visit (HOSPITAL_COMMUNITY): Payer: Self-pay

## 2022-03-16 ENCOUNTER — Ambulatory Visit (INDEPENDENT_AMBULATORY_CARE_PROVIDER_SITE_OTHER): Payer: BLUE CROSS/BLUE SHIELD

## 2022-03-16 VITALS — BP 130/78 | HR 74 | Temp 98.4°F

## 2022-03-16 DIAGNOSIS — Z23 Encounter for immunization: Secondary | ICD-10-CM

## 2022-03-16 NOTE — Patient Instructions (Signed)
Zoster Vaccine, Recombinant injection What is this medication? ZOSTER VACCINE (ZOS ter vak SEEN) is a vaccine used to reduce the risk of getting shingles. This vaccine is not used to treat shingles or nerve pain from shingles. This medicine may be used for other purposes; ask your health care provider or pharmacist if you have questions. COMMON BRAND NAME(S): [place] What should I tell my care team before I take this medication? They need to know if you have any of these conditions: cancer immune system problems an unusual or allergic reaction to Zoster vaccine, other medications, foods, dyes, or preservatives pregnant or trying to get pregnant breast-feeding How should I use this medication? This vaccine is injected into a muscle. It is given by a health care provider. A copy of Vaccine Information Statements will be given before each vaccination. Be sure to read this information carefully each time. This sheet may change often. Talk to your health care provider about the use of this vaccine in children. This vaccine is not approved for use in children. Overdosage: If you think you have taken too much of this medicine contact a poison control center or emergency room at once. NOTE: This medicine is only for you. Do not share this medicine with others. What if I miss a dose? Keep appointments for follow-up (booster) doses. It is important not to miss your dose. Call your health care provider if you are unable to keep an appointment. What may interact with this medication? medicines that suppress your immune system medicines to treat cancer steroid medicines like prednisone or cortisone This list may not describe all possible interactions. Give your health care provider a list of all the medicines, herbs, non-prescription drugs, or dietary supplements you use. Also tell them if you smoke, drink alcohol, or use illegal drugs. Some items may interact with your medicine. What should I watch for  while using this medication? Visit your health care provider regularly. This vaccine, like all vaccines, may not fully protect everyone. What side effects may I notice from receiving this medication? Side effects that you should report to your doctor or health care professional as soon as possible: allergic reactions (skin rash, itching or hives; swelling of the face, lips, or tongue) trouble breathing Side effects that usually do not require medical attention (report these to your doctor or health care professional if they continue or are bothersome): chills headache fever nausea pain, redness, or irritation at site where injected tiredness vomiting This list may not describe all possible side effects. Call your doctor for medical advice about side effects. You may report side effects to FDA at 1-800-FDA-1088. Where should I keep my medication? This vaccine is only given by a health care provider. It will not be stored at home. NOTE: This sheet is a summary. It may not cover all possible information. If you have questions about this medicine, talk to your doctor, pharmacist, or health care provider.  2023 Elsevier/Gold Standard (2021-05-08 00:00:00)

## 2022-03-16 NOTE — Progress Notes (Signed)
Patient presents today for 2nd shingles vaccine.  ?

## 2022-04-17 ENCOUNTER — Other Ambulatory Visit: Payer: Self-pay

## 2022-04-17 ENCOUNTER — Other Ambulatory Visit (HOSPITAL_COMMUNITY): Payer: Self-pay

## 2022-04-19 ENCOUNTER — Other Ambulatory Visit (HOSPITAL_COMMUNITY): Payer: Self-pay

## 2022-04-20 ENCOUNTER — Other Ambulatory Visit: Payer: Self-pay

## 2022-04-20 ENCOUNTER — Other Ambulatory Visit (HOSPITAL_COMMUNITY): Payer: Self-pay

## 2022-04-30 ENCOUNTER — Other Ambulatory Visit: Payer: Self-pay | Admitting: Internal Medicine

## 2022-04-30 ENCOUNTER — Other Ambulatory Visit (HOSPITAL_COMMUNITY): Payer: Self-pay

## 2022-04-30 DIAGNOSIS — R252 Cramp and spasm: Secondary | ICD-10-CM

## 2022-05-03 ENCOUNTER — Other Ambulatory Visit (HOSPITAL_COMMUNITY): Payer: Self-pay

## 2022-05-03 MED ORDER — CYCLOBENZAPRINE HCL 10 MG PO TABS
10.0000 mg | ORAL_TABLET | Freq: Three times a day (TID) | ORAL | 0 refills | Status: DC | PRN
  Filled 2022-05-03 – 2022-05-18 (×2): qty 30, 10d supply, fill #0

## 2022-05-05 ENCOUNTER — Encounter (HOSPITAL_COMMUNITY): Payer: Self-pay

## 2022-05-05 ENCOUNTER — Other Ambulatory Visit (HOSPITAL_COMMUNITY): Payer: Self-pay

## 2022-05-12 ENCOUNTER — Other Ambulatory Visit (HOSPITAL_COMMUNITY): Payer: Self-pay

## 2022-05-18 ENCOUNTER — Other Ambulatory Visit (HOSPITAL_COMMUNITY): Payer: Self-pay

## 2022-05-21 ENCOUNTER — Other Ambulatory Visit (HOSPITAL_COMMUNITY): Payer: Self-pay

## 2022-05-21 ENCOUNTER — Encounter (HOSPITAL_COMMUNITY): Payer: Self-pay

## 2022-05-25 ENCOUNTER — Other Ambulatory Visit (HOSPITAL_COMMUNITY): Payer: Self-pay

## 2022-06-23 ENCOUNTER — Other Ambulatory Visit (HOSPITAL_COMMUNITY): Payer: Self-pay

## 2022-07-14 ENCOUNTER — Ambulatory Visit: Payer: Self-pay | Admitting: Internal Medicine

## 2022-07-28 ENCOUNTER — Encounter: Payer: Self-pay | Admitting: Internal Medicine

## 2022-07-28 NOTE — Patient Instructions (Signed)
Hypertension, Adult Hypertension is another name for high blood pressure. High blood pressure forces your heart to work harder to pump blood. This can cause problems over time. There are two numbers in a blood pressure reading. There is a top number (systolic) over a bottom number (diastolic). It is best to have a blood pressure that is below 120/80. What are the causes? The cause of this condition is not known. Some other conditions can lead to high blood pressure. What increases the risk? Some lifestyle factors can make you more likely to develop high blood pressure: Smoking. Not getting enough exercise or physical activity. Being overweight. Having too much fat, sugar, calories, or salt (sodium) in your diet. Drinking too much alcohol. Other risk factors include: Having any of these conditions: Heart disease. Diabetes. High cholesterol. Kidney disease. Obstructive sleep apnea. Having a family history of high blood pressure and high cholesterol. Age. The risk increases with age. Stress. What are the signs or symptoms? High blood pressure may not cause symptoms. Very high blood pressure (hypertensive crisis) may cause: Headache. Fast or uneven heartbeats (palpitations). Shortness of breath. Nosebleed. Vomiting or feeling like you may vomit (nauseous). Changes in how you see. Very bad chest pain. Feeling dizzy. Seizures. How is this treated? This condition is treated by making healthy lifestyle changes, such as: Eating healthy foods. Exercising more. Drinking less alcohol. Your doctor may prescribe medicine if lifestyle changes do not help enough and if: Your top number is above 130. Your bottom number is above 80. Your personal target blood pressure may vary. Follow these instructions at home: Eating and drinking  If told, follow the DASH eating plan. To follow this plan: Fill one half of your plate at each meal with fruits and vegetables. Fill one fourth of your plate  at each meal with whole grains. Whole grains include whole-wheat pasta, [place] rice, and whole-grain bread. Eat or drink low-fat dairy products, such as skim milk or low-fat yogurt. Fill one fourth of your plate at each meal with low-fat (lean) proteins. Low-fat proteins include fish, chicken without skin, eggs, beans, and tofu. Avoid fatty meat, cured and processed meat, or chicken with skin. Avoid pre-made or processed food. Limit the amount of salt in your diet to less than 1,500 mg each day. Do not drink alcohol if: Your doctor tells you not to drink. You are pregnant, may be pregnant, or are planning to become pregnant. If you drink alcohol: Limit how much you have to: 0-1 drink a day for women. 0-2 drinks a day for men. Know how much alcohol is in your drink. In the [place] one drink equals one 12 oz bottle of beer (355 mL), one 5 oz glass of wine (148 mL), or one 1 oz glass of hard liquor (44 mL). Lifestyle  Work with your doctor to stay at a healthy weight or to lose weight. Ask your doctor what the best weight is for you. Get at least 30 minutes of exercise that causes your heart to beat faster (aerobic exercise) most days of the week. This may include walking, swimming, or biking. Get at least 30 minutes of exercise that strengthens your muscles (resistance exercise) at least 3 days a week. This may include lifting weights or doing Pilates. Do not smoke or use any products that contain nicotine or tobacco. If you need help quitting, ask your doctor. Check your blood pressure at home as told by your doctor. Keep all follow-up visits. Medicines Take over-the-counter and prescription medicines  only as told by your doctor. Follow directions carefully. ?Do not skip doses of blood pressure medicine. The medicine does not work as well if you skip doses. Skipping doses also puts you at risk for problems. ?Ask your doctor about side effects or reactions to medicines that you should watch  for. ?Contact a doctor if: ?You think you are having a reaction to the medicine you are taking. ?You have headaches that keep coming back. ?You feel dizzy. ?You have swelling in your ankles. ?You have trouble with your vision. ?Get help right away if: ?You get a very bad headache. ?You start to feel mixed up (confused). ?You feel weak or numb. ?You feel faint. ?You have very bad pain in your: ?Chest. ?Belly (abdomen). ?You vomit more than once. ?You have trouble breathing. ?These symptoms may be an emergency. Get help right away. Call 911. ?Do not wait to see if the symptoms will go away. ?Do not drive yourself to the hospital. ?Summary ?Hypertension is another name for high blood pressure. ?High blood pressure forces your heart to work harder to pump blood. ?For most people, a normal blood pressure is less than 120/80. ?Making healthy choices can help lower blood pressure. If your blood pressure does not get lower with healthy choices, you may need to take medicine. ?This information is not intended to replace advice given to you by your health care provider. Make sure you discuss any questions you have with your health care provider. ?Document Revised: 03/26/2021 Document Reviewed: 03/26/2021 ?Elsevier Patient Education ? 2023 Elsevier Inc. ? ?

## 2022-07-28 NOTE — Progress Notes (Signed)
Appt cancelled due to insurance issues

## 2022-08-11 ENCOUNTER — Encounter: Payer: Self-pay | Admitting: Internal Medicine

## 2022-08-11 NOTE — Patient Instructions (Signed)
Hypertension, Adult ?Hypertension is another name for high blood pressure. High blood pressure forces your heart to work harder to pump blood. This can cause problems over time. ?There are two numbers in a blood pressure reading. There is a top number (systolic) over a bottom number (diastolic). It is best to have a blood pressure that is below 120/80. ?What are the causes? ?The cause of this condition is not known. Some other conditions can lead to high blood pressure. ?What increases the risk? ?Some lifestyle factors can make you more likely to develop high blood pressure: ?Smoking. ?Not getting enough exercise or physical activity. ?Being overweight. ?Having too much fat, sugar, calories, or salt (sodium) in your diet. ?Drinking too much alcohol. ?Other risk factors include: ?Having any of these conditions: ?Heart disease. ?Diabetes. ?High cholesterol. ?Kidney disease. ?Obstructive sleep apnea. ?Having a family history of high blood pressure and high cholesterol. ?Age. The risk increases with age. ?Stress. ?What are the signs or symptoms? ?High blood pressure may not cause symptoms. Very high blood pressure (hypertensive crisis) may cause: ?Headache. ?Fast or uneven heartbeats (palpitations). ?Shortness of breath. ?Nosebleed. ?Vomiting or feeling like you may vomit (nauseous). ?Changes in how you see. ?Very bad chest pain. ?Feeling dizzy. ?Seizures. ?How is this treated? ?This condition is treated by making healthy lifestyle changes, such as: ?Eating healthy foods. ?Exercising more. ?Drinking less alcohol. ?Your doctor may prescribe medicine if lifestyle changes do not help enough and if: ?Your top number is above 130. ?Your bottom number is above 80. ?Your personal target blood pressure may vary. ?Follow these instructions at home: ?Eating and drinking ? ?If told, follow the DASH eating plan. To follow this plan: ?Fill one half of your plate at each meal with fruits and vegetables. ?Fill one fourth of your plate  at each meal with whole grains. Whole grains include whole-wheat pasta, [place] rice, and whole-grain bread. ?Eat or drink low-fat dairy products, such as skim milk or low-fat yogurt. ?Fill one fourth of your plate at each meal with low-fat (lean) proteins. Low-fat proteins include fish, chicken without skin, eggs, beans, and tofu. ?Avoid fatty meat, cured and processed meat, or chicken with skin. ?Avoid pre-made or processed food. ?Limit the amount of salt in your diet to less than 1,500 mg each day. ?Do not drink alcohol if: ?Your doctor tells you not to drink. ?You are pregnant, may be pregnant, or are planning to become pregnant. ?If you drink alcohol: ?Limit how much you have to: ?0-1 drink a day for women. ?0-2 drinks a day for men. ?Know how much alcohol is in your drink. In the [place] one drink equals one 12 oz bottle of beer (355 mL), one 5 oz glass of wine (148 mL), or one 1? oz glass of hard liquor (44 mL). ?Lifestyle ? ?Work with your doctor to stay at a healthy weight or to lose weight. Ask your doctor what the best weight is for you. ?Get at least 30 minutes of exercise that causes your heart to beat faster (aerobic exercise) most days of the week. This may include walking, swimming, or biking. ?Get at least 30 minutes of exercise that strengthens your muscles (resistance exercise) at least 3 days a week. This may include lifting weights or doing Pilates. ?Do not smoke or use any products that contain nicotine or tobacco. If you need help quitting, ask your doctor. ?Check your blood pressure at home as told by your doctor. ?Keep all follow-up visits. ?Medicines ?Take over-the-counter and prescription medicines   only as told by your doctor. Follow directions carefully. ?Do not skip doses of blood pressure medicine. The medicine does not work as well if you skip doses. Skipping doses also puts you at risk for problems. ?Ask your doctor about side effects or reactions to medicines that you should watch  for. ?Contact a doctor if: ?You think you are having a reaction to the medicine you are taking. ?You have headaches that keep coming back. ?You feel dizzy. ?You have swelling in your ankles. ?You have trouble with your vision. ?Get help right away if: ?You get a very bad headache. ?You start to feel mixed up (confused). ?You feel weak or numb. ?You feel faint. ?You have very bad pain in your: ?Chest. ?Belly (abdomen). ?You vomit more than once. ?You have trouble breathing. ?These symptoms may be an emergency. Get help right away. Call 911. ?Do not wait to see if the symptoms will go away. ?Do not drive yourself to the hospital. ?Summary ?Hypertension is another name for high blood pressure. ?High blood pressure forces your heart to work harder to pump blood. ?For most people, a normal blood pressure is less than 120/80. ?Making healthy choices can help lower blood pressure. If your blood pressure does not get lower with healthy choices, you may need to take medicine. ?This information is not intended to replace advice given to you by your health care provider. Make sure you discuss any questions you have with your health care provider. ?Document Revised: 03/26/2021 Document Reviewed: 03/26/2021 ?Elsevier Patient Education ? 2023 Elsevier Inc. ? ?

## 2022-08-11 NOTE — Progress Notes (Signed)
Cancelled.  

## 2022-08-18 ENCOUNTER — Ambulatory Visit: Payer: Self-pay | Admitting: Internal Medicine

## 2022-08-24 ENCOUNTER — Other Ambulatory Visit (HOSPITAL_COMMUNITY): Payer: Self-pay

## 2022-08-25 ENCOUNTER — Encounter: Payer: Self-pay | Admitting: Internal Medicine

## 2022-08-25 ENCOUNTER — Ambulatory Visit (INDEPENDENT_AMBULATORY_CARE_PROVIDER_SITE_OTHER): Payer: 59 | Admitting: Internal Medicine

## 2022-08-25 VITALS — BP 124/88 | Temp 97.7°F | Ht 65.0 in | Wt 331.4 lb

## 2022-08-25 DIAGNOSIS — Z6841 Body Mass Index (BMI) 40.0 and over, adult: Secondary | ICD-10-CM | POA: Diagnosis not present

## 2022-08-25 DIAGNOSIS — I10 Essential (primary) hypertension: Secondary | ICD-10-CM | POA: Diagnosis not present

## 2022-08-25 DIAGNOSIS — R7309 Other abnormal glucose: Secondary | ICD-10-CM

## 2022-08-25 DIAGNOSIS — R0982 Postnasal drip: Secondary | ICD-10-CM

## 2022-08-25 DIAGNOSIS — E559 Vitamin D deficiency, unspecified: Secondary | ICD-10-CM

## 2022-08-25 LAB — POCT URINALYSIS DIPSTICK
Bilirubin, UA: NEGATIVE
Blood, UA: NEGATIVE
Glucose, UA: NEGATIVE
Ketones, UA: NEGATIVE
Nitrite, UA: NEGATIVE
Protein, UA: NEGATIVE
Spec Grav, UA: 1.02 (ref 1.010–1.025)
Urobilinogen, UA: 0.2 E.U./dL
pH, UA: 6.5 (ref 5.0–8.0)

## 2022-08-25 MED ORDER — OLMESARTAN MEDOXOMIL-HCTZ 20-12.5 MG PO TABS
1.0000 | ORAL_TABLET | Freq: Every day | ORAL | 1 refills | Status: DC
  Filled 2022-08-25: qty 90, 90d supply, fill #0
  Filled 2022-10-07 (×2): qty 30, 30d supply, fill #0
  Filled 2022-12-01 (×2): qty 30, 30d supply, fill #1
  Filled 2023-01-31 (×2): qty 30, 30d supply, fill #2
  Filled 2023-03-30: qty 30, 30d supply, fill #3
  Filled 2023-05-17 (×3): qty 30, 30d supply, fill #4
  Filled 2023-06-10: qty 30, 30d supply, fill #5

## 2022-08-25 NOTE — Progress Notes (Signed)
I,Donna Crawford,acting as a scribe for Donna Greenland, MD.,have documented all relevant documentation on the behalf of Donna Greenland, MD,as directed by  Donna Greenland, MD while in the presence of Donna Greenland, MD.    Subjective:     Patient ID: Donna Crawford , female    DOB: 1961/05/01 , 62 y.o.   MRN: [id number]   Chief Complaint  Patient presents with   Hypertension    HPI  Pt presents today for bpc. She reports compliance with medications. Denies headache, chest pain, SOB.  She adds, she has a lingering cough productive of discolored mucus. She has not had a fever. 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   Hypertension This is a chronic problem. The current episode started more than 1 year ago. The problem is controlled. Associated symptoms include peripheral edema. Pertinent negatives include no chest pain, headaches, palpitations or shortness of breath. (Contributed to being on her feet for 12 hrs per day and is relieved by elevation.) There are no associated agents to hypertension. Risk factors for coronary artery disease include sedentary lifestyle and obesity. Past treatments include diuretics and angiotensin blockers. The current treatment provides moderate improvement. There are no compliance problems.  There is no history of angina. There is no history of chronic renal disease.     Past Medical History:  Diagnosis Date   H/O varicella    History of measles, mumps, or rubella    Hypertension    Non-atypical endometrial cells on cervical Pap smear 2008     Family History  Problem Relation Age of Onset   Diabetes Mother    Hypertension Mother    Cancer Father    Heart disease Father    Hypertension Father    Stroke Father    Hypertension Sister    Cancer Sister    Diabetes Brother    Hypertension Brother    Diabetes Sister    Hypertension Sister      Current Outpatient Medications:    Ascorbic Acid (VITAMIN C PO), Take by  mouth., Disp: , Rfl:    Cholecalciferol (VITAMIN D) 50 MCG (2000 UT) tablet, Take 1 tablet (2,000 Units total) by mouth daily., Disp: 90 tablet, Rfl: 0   Cyanocobalamin (B-12 PO), Take by mouth., Disp: , Rfl:    cyclobenzaprine (FLEXERIL) 10 MG tablet, Take 1 tablet (10 mg total) by mouth 3 (three) times daily as needed for muscle spasms., Disp: 30 tablet, Rfl: 0   Melatonin 3 MG TABS, Take by mouth., Disp: , Rfl:    Multiple Vitamins-Minerals (ZINC PO), Take by mouth., Disp: , Rfl:    Omega-3 Fatty Acids (OMEGA 3 PO), Take by mouth., Disp: , Rfl:    fluticasone (FLONASE) 50 MCG/ACT nasal spray, Place 2 sprays into both nostrils daily. (Patient not taking: Reported on 01/11/2022), Disp: 16 g, Rfl: 2   glucosamine-chondroitin 500-400 MG tablet, Take 1 tablet by mouth 1 day or 1 dose. (Patient not taking: Reported on 01/11/2022), Disp: 90 tablet, Rfl: 0   olmesartan-hydrochlorothiazide (BENICAR HCT) 20-12.5 MG tablet, Take 1 tablet by mouth daily., Disp: 90 tablet, Rfl: 1   No Known Allergies   Review of Systems  Constitutional: Negative.  Negative for chills and fever.  HENT:  Positive for postnasal drip.   Respiratory: Negative.  Negative for shortness of breath.   Cardiovascular: Negative.  Negative for chest pain and palpitations.  Gastrointestinal: Negative.   Musculoskeletal: Negative.   Neurological:  Negative.  Negative for headaches.  Psychiatric/Behavioral: Negative.       Today's Vitals   08/25/22 1609  BP: 124/88  Temp: 97.7 F (36.5 C)  SpO2: 98%  Weight: (!) 331 lb 6.4 oz (150.3 kg)  Height: '5\' 5"'$  (1.651 m)   Body mass index is 55.15 kg/m.  Wt Readings from Last 3 Encounters:  08/25/22 (!) 331 lb 6.4 oz (150.3 kg)  01/11/22 (!) 329 lb 6.4 oz (149.4 kg)  06/24/21 (!) 320 lb (145.2 kg)    Objective:  Physical Exam Vitals and nursing note reviewed.  Constitutional:      Appearance: Normal appearance. She is obese.  HENT:     Head: Normocephalic and atraumatic.      Nose:     Comments: Masked     Mouth/Throat:     Comments: Masked  Eyes:     Extraocular Movements: Extraocular movements intact.  Cardiovascular:     Rate and Rhythm: Normal rate and regular rhythm.     Heart sounds: Normal heart sounds.  Pulmonary:     Effort: Pulmonary effort is normal.     Breath sounds: Normal breath sounds.  Musculoskeletal:     Cervical back: Normal range of motion.  Skin:    General: Skin is warm.  Neurological:     General: No focal deficit present.     Mental Status: She is alert.  Psychiatric:        Mood and Affect: Mood normal.        Behavior: Behavior normal.     Assessment And Plan:     1. Essential hypertension Comments: Chronic, fair control. She will c/w olmesartan/hct 20/12.'5mg'$  daily for now. She is encouraged to follow a low sodium diet. - POCT Urinalysis Dipstick (81002) - Microalbumin / creatinine urine ratio - CMP14+EGFR - CBC - olmesartan-hydrochlorothiazide (BENICAR HCT) 20-12.5 MG tablet; Take 1 tablet by mouth daily.  Dispense: 90 tablet; Refill: 1  2. Postnasal drip Comments: She is advised to try Zyrtec, '10mg'$  nightly to address her sx. Advised to avoid dairy while symptomatic. She will let me know if her sx persist.  3. Other abnormal glucose Comments: Previous labs reviewed, her a1c has been elevated in the past. I will recheck an a1c today. She is encouraged to decrease intake of sugary drinks/foods. - Hemoglobin A1c  4. Vitamin D deficiency Comments: I will check a vitamin D level and supplement as needed. - Vitamin D (25 hydroxy)  5. Class 3 severe obesity due to excess calories without serious comorbidity with body mass index (BMI) of 50.0 to 59.9 in adult [facility]) Comments: She is encouraged to strive for BMI less than 30 to decrease cardiac risk. Advised to aim for at least 150 minutes of exercise per week.   Patient was given opportunity to ask questions. Patient verbalized understanding of the plan and was able to  repeat key elements of the plan. All questions were answered to their satisfaction.   I, Donna Greenland, MD, have reviewed all documentation for this visit. The documentation on 08/25/22 for the exam, diagnosis, procedures, and orders are all accurate and complete.   IF YOU HAVE BEEN REFERRED TO A SPECIALIST, IT MAY TAKE 1-2 WEEKS TO SCHEDULE/PROCESS THE REFERRAL. IF YOU HAVE NOT HEARD FROM US/SPECIALIST IN TWO WEEKS, PLEASE GIVE [place] A CALL AT [phone].   THE PATIENT IS ENCOURAGED TO PRACTICE SOCIAL DISTANCING DUE TO THE COVID-19 PANDEMIC.

## 2022-08-25 NOTE — Patient Instructions (Signed)
Hypertension, Adult Hypertension is another name for high blood pressure. High blood pressure forces your heart to work harder to pump blood. This can cause problems over time. There are two numbers in a blood pressure reading. There is a top number (systolic) over a bottom number (diastolic). It is best to have a blood pressure that is below 120/80. What are the causes? The cause of this condition is not known. Some other conditions can lead to high blood pressure. What increases the risk? Some lifestyle factors can make you more likely to develop high blood pressure: Smoking. Not getting enough exercise or physical activity. Being overweight. Having too much fat, sugar, calories, or salt (sodium) in your diet. Drinking too much alcohol. Other risk factors include: Having any of these conditions: Heart disease. Diabetes. High cholesterol. Kidney disease. Obstructive sleep apnea. Having a family history of high blood pressure and high cholesterol. Age. The risk increases with age. Stress. What are the signs or symptoms? High blood pressure may not cause symptoms. Very high blood pressure (hypertensive crisis) may cause: Headache. Fast or uneven heartbeats (palpitations). Shortness of breath. Nosebleed. Vomiting or feeling like you may vomit (nauseous). Changes in how you see. Very bad chest pain. Feeling dizzy. Seizures. How is this treated? This condition is treated by making healthy lifestyle changes, such as: Eating healthy foods. Exercising more. Drinking less alcohol. Your doctor may prescribe medicine if lifestyle changes do not help enough and if: Your top number is above 130. Your bottom number is above 80. Your personal target blood pressure may vary. Follow these instructions at home: Eating and drinking  If told, follow the DASH eating plan. To follow this plan: Fill one half of your plate at each meal with fruits and vegetables. Fill one fourth of your plate  at each meal with whole grains. Whole grains include whole-wheat pasta, [place] rice, and whole-grain bread. Eat or drink low-fat dairy products, such as skim milk or low-fat yogurt. Fill one fourth of your plate at each meal with low-fat (lean) proteins. Low-fat proteins include fish, chicken without skin, eggs, beans, and tofu. Avoid fatty meat, cured and processed meat, or chicken with skin. Avoid pre-made or processed food. Limit the amount of salt in your diet to less than 1,500 mg each day. Do not drink alcohol if: Your doctor tells you not to drink. You are pregnant, may be pregnant, or are planning to become pregnant. If you drink alcohol: Limit how much you have to: 0-1 drink a day for women. 0-2 drinks a day for men. Know how much alcohol is in your drink. In the [place] one drink equals one 12 oz bottle of beer (355 mL), one 5 oz glass of wine (148 mL), or one 1 oz glass of hard liquor (44 mL). Lifestyle  Work with your doctor to stay at a healthy weight or to lose weight. Ask your doctor what the best weight is for you. Get at least 30 minutes of exercise that causes your heart to beat faster (aerobic exercise) most days of the week. This may include walking, swimming, or biking. Get at least 30 minutes of exercise that strengthens your muscles (resistance exercise) at least 3 days a week. This may include lifting weights or doing Pilates. Do not smoke or use any products that contain nicotine or tobacco. If you need help quitting, ask your doctor. Check your blood pressure at home as told by your doctor. Keep all follow-up visits. Medicines Take over-the-counter and prescription medicines   only as told by your doctor. Follow directions carefully. Do not skip doses of blood pressure medicine. The medicine does not work as well if you skip doses. Skipping doses also puts you at risk for problems. Ask your doctor about side effects or reactions to medicines that you should watch  for. Contact a doctor if: You think you are having a reaction to the medicine you are taking. You have headaches that keep coming back. You feel dizzy. You have swelling in your ankles. You have trouble with your vision. Get help right away if: You get a very bad headache. You start to feel mixed up (confused). You feel weak or numb. You feel faint. You have very bad pain in your: Chest. Belly (abdomen). You vomit more than once. You have trouble breathing. These symptoms may be an emergency. Get help right away. Call 911. Do not wait to see if the symptoms will go away. Do not drive yourself to the hospital. Summary Hypertension is another name for high blood pressure. High blood pressure forces your heart to work harder to pump blood. For most people, a normal blood pressure is less than 120/80. Making healthy choices can help lower blood pressure. If your blood pressure does not get lower with healthy choices, you may need to take medicine. This information is not intended to replace advice given to you by your health care provider. Make sure you discuss any questions you have with your health care provider. Document Revised: 03/26/2021 Document Reviewed: 03/26/2021 Elsevier Patient Education  2023 Elsevier Inc.  

## 2022-08-26 ENCOUNTER — Other Ambulatory Visit (HOSPITAL_COMMUNITY): Payer: Self-pay

## 2022-08-26 LAB — CBC
Hematocrit: 38.4 % (ref 34.0–46.6)
Hemoglobin: 12.4 g/dL (ref 11.1–15.9)
MCH: 26.9 pg (ref 26.6–33.0)
MCHC: 32.3 g/dL (ref 31.5–35.7)
MCV: 83 fL (ref 79–97)
Platelets: 293 10*3/uL (ref 150–450)
RBC: 4.61 x10E6/uL (ref 3.77–5.28)
RDW: 14.3 % (ref 11.7–15.4)
WBC: 5.6 10*3/uL (ref 3.4–10.8)

## 2022-08-26 LAB — CMP14+EGFR
ALT: 12 IU/L (ref 0–32)
AST: 15 IU/L (ref 0–40)
Albumin/Globulin Ratio: 1.4 (ref 1.2–2.2)
Albumin: 4.1 g/dL (ref 3.8–4.9)
Alkaline Phosphatase: 61 IU/L (ref 44–121)
BUN/Creatinine Ratio: 10 — ABNORMAL LOW (ref 12–28)
BUN: 10 mg/dL (ref 8–27)
Bilirubin Total: 0.3 mg/dL (ref 0.0–1.2)
CO2: 25 mmol/L (ref 20–29)
Calcium: 9.4 mg/dL (ref 8.7–10.3)
Chloride: 101 mmol/L (ref 96–106)
Creatinine, Ser: 0.96 mg/dL (ref 0.57–1.00)
Globulin, Total: 2.9 g/dL (ref 1.5–4.5)
Glucose: 82 mg/dL (ref 70–99)
Potassium: 3.9 mmol/L (ref 3.5–5.2)
Sodium: 142 mmol/L (ref 134–144)
Total Protein: 7 g/dL (ref 6.0–8.5)
eGFR: 68 mL/min/{1.73_m2} (ref >59)

## 2022-08-26 LAB — MICROALBUMIN / CREATININE URINE RATIO
Creatinine, Urine: 185.7 mg/dL
Microalb/Creat Ratio: 9 mg/g creat (ref 0–29)
Microalbumin, Urine: 17.1 ug/mL

## 2022-08-26 LAB — HEMOGLOBIN A1C
Est. average glucose Bld gHb Est-mCnc: 123 mg/dL
Hgb A1c MFr Bld: 5.9 % — ABNORMAL HIGH (ref 4.8–5.6)

## 2022-08-26 LAB — VITAMIN D 25 HYDROXY (VIT D DEFICIENCY, FRACTURES): Vit D, 25-Hydroxy: 20.8 ng/mL — ABNORMAL LOW (ref 30.0–100.0)

## 2022-10-07 ENCOUNTER — Other Ambulatory Visit (HOSPITAL_COMMUNITY): Payer: Self-pay

## 2022-11-17 ENCOUNTER — Encounter: Payer: Self-pay | Admitting: Internal Medicine

## 2022-11-29 ENCOUNTER — Other Ambulatory Visit: Payer: Self-pay | Admitting: Internal Medicine

## 2022-11-29 DIAGNOSIS — Z789 Other specified health status: Secondary | ICD-10-CM

## 2022-11-29 DIAGNOSIS — Z111 Encounter for screening for respiratory tuberculosis: Secondary | ICD-10-CM

## 2022-11-29 DIAGNOSIS — Z79899 Other long term (current) drug therapy: Secondary | ICD-10-CM

## 2022-12-01 ENCOUNTER — Other Ambulatory Visit (HOSPITAL_COMMUNITY): Payer: Self-pay

## 2022-12-03 ENCOUNTER — Encounter: Payer: Self-pay | Admitting: *Deleted

## 2022-12-03 NOTE — Progress Notes (Signed)
[facility] Quality Team Note  Name: Donna Crawford Date of Birth: July 15, 1960 MRN: [id number] Date: 12/03/2022  [facility] Quality Team has reviewed this patient's chart, please see recommendations below:  [facility] Quality Other; Pt has open gaps for mammogram and colon screening.  Pt has ov on 01/18/23.  Would provider be able to address at ov?

## 2022-12-08 ENCOUNTER — Other Ambulatory Visit: Payer: Self-pay

## 2022-12-08 DIAGNOSIS — Z789 Other specified health status: Secondary | ICD-10-CM

## 2022-12-08 DIAGNOSIS — Z111 Encounter for screening for respiratory tuberculosis: Secondary | ICD-10-CM

## 2022-12-08 DIAGNOSIS — Z79899 Other long term (current) drug therapy: Secondary | ICD-10-CM

## 2022-12-13 LAB — QUANTIFERON-TB GOLD PLUS
QuantiFERON Mitogen Value: >10 IU/mL
QuantiFERON Nil Value: 0.03 IU/mL
QuantiFERON TB1 Ag Value: 0.03 IU/mL
QuantiFERON TB2 Ag Value: 0.03 IU/mL
QuantiFERON-TB Gold Plus: NEGATIVE

## 2022-12-13 LAB — MEASLES/MUMPS/RUBELLA IMMUNITY
MUMPS ABS, IGG: 299 AU/mL (ref >10.9)
RUBEOLA AB, IGG: >300 AU/mL (ref >16.4)
Rubella Antibodies, IGG: 6.43 index (ref >0.99)

## 2022-12-13 LAB — HEPATITIS B SURFACE ANTIBODY,QUALITATIVE: Hep B Surface Ab, Qual: NONREACTIVE

## 2022-12-13 LAB — VARICELLA ZOSTER ANTIBODY, IGG: Varicella zoster IgG: >4000 index (ref >165)

## 2023-01-18 ENCOUNTER — Encounter: Payer: Self-pay | Admitting: Internal Medicine

## 2023-01-18 NOTE — Progress Notes (Deleted)
I, T Deloria Lair, CMA,acting as a Neurosurgeon for Gwynneth Aliment, MD.,have documented all relevant documentation on the behalf of Gwynneth Aliment, MD,as directed by  Gwynneth Aliment, MD while in the presence of Gwynneth Aliment, MD.  Subjective:    Patient ID: Donna Crawford , female    DOB: 02-Aug-1960 , 62 y.o.   MRN: [id number]  No chief complaint on file.   HPI  Pt is here for HM. She is not currently followed by GYN. She can't recall when she was last seen by [facility]. She reports compliance with meds. Reports she has recently started [facility]ob[facility]. States she loves it!   Hypertension This is a chronic problem. The current episode started more than 1 year ago. The problem is controlled. Associated symptoms include peripheral edema. (Contributed to being on her feet for 12 hrs per day and is relieved by elevation.) There are no associated agents to hypertension. Risk factors for coronary artery disease include sedentary lifestyle and obesity. Past treatments include diuretics and angiotensin blockers. The current treatment provides moderate improvement. There are no compliance problems.  There is no history of angina. There is no history of chronic renal disease.     Past Medical History:  Diagnosis Date   H/O varicella    History of measles, mumps, or rubella    Hypertension    Non-atypical endometrial cells on cervical Pap smear 2008     Family History  Problem Relation Age of Onset   Diabetes Mother    Hypertension Mother    Cancer Father    Heart disease Father    Hypertension Father    Stroke Father    Hypertension Sister    Cancer Sister    Diabetes Brother    Hypertension Brother    Diabetes Sister    Hypertension Sister      Current Outpatient Medications:    Ascorbic Acid (VITAMIN C PO), Take by mouth., Disp: , Rfl:    Cholecalciferol (VITAMIN D) 50 MCG (2000 UT) tablet, Take 1 tablet (2,000 Units total) by mouth daily., Disp: 90 tablet, Rfl:  0   Cyanocobalamin (B-12 PO), Take by mouth., Disp: , Rfl:    cyclobenzaprine (FLEXERIL) 10 MG tablet, Take 1 tablet (10 mg total) by mouth 3 (three) times daily as needed for muscle spasms., Disp: 30 tablet, Rfl: 0   fluticasone (FLONASE) 50 MCG/ACT nasal spray, Place 2 sprays into both nostrils daily. (Patient not taking: Reported on 01/11/2022), Disp: 16 g, Rfl: 2   glucosamine-chondroitin 500-400 MG tablet, Take 1 tablet by mouth 1 day or 1 dose. (Patient not taking: Reported on 01/11/2022), Disp: 90 tablet, Rfl: 0   Melatonin 3 MG TABS, Take by mouth., Disp: , Rfl:    Multiple Vitamins-Minerals (ZINC PO), Take by mouth., Disp: , Rfl:    olmesartan-hydrochlorothiazide (BENICAR HCT) 20-12.5 MG tablet, Take 1 tablet by mouth daily., Disp: 90 tablet, Rfl: 1   Omega-3 Fatty Acids (OMEGA 3 PO), Take by mouth., Disp: , Rfl:    No Known Allergies    The patient states she uses {contraceptive methods:5051} for birth control. Patient's last menstrual period was 12/03/2012.. {Dysmenorrhea-menorrhagia:21918}. Negative for: breast discharge, breast lump(s), breast pain and breast self exam. Associated symptoms include abnormal vaginal bleeding. Pertinent negatives include abnormal bleeding (hematology), anxiety, decreased libido, depression, difficulty falling sleep, dyspareunia, history of infertility, nocturia, sexual dysfunction, sleep disturbances, urinary incontinence, urinary urgency, vaginal discharge and vaginal itching. Diet regular.The patient states her  exercise level is    . The patient's tobacco use is:  Social History   Tobacco Use  Smoking Status Never  Smokeless Tobacco Never  . She has been exposed to passive smoke. The patient's alcohol use is:  Social History   Substance and Sexual Activity  Alcohol Use Yes   Comment: Occasional wine  . Additional information: Last pap ***, next one scheduled for ***.    Review of Systems  Constitutional: Negative.   HENT: Negative.    Eyes:  Negative.   Respiratory: Negative.    Cardiovascular: Negative.   Gastrointestinal: Negative.   Endocrine: Negative.   Genitourinary: Negative.   Musculoskeletal: Negative.   Skin: Negative.   Allergic/Immunologic: Negative.   Neurological: Negative.   Hematological: Negative.   Psychiatric/Behavioral: Negative.       There were no vitals filed for this visit. There is no height or weight on file to calculate BMI.  Wt Readings from Last 3 Encounters:  08/25/22 (!) 331 lb 6.4 oz (150.3 kg)  01/11/22 (!) 329 lb 6.4 oz (149.4 kg)  06/24/21 (!) 320 lb (145.2 kg)     Objective:  Physical Exam      Assessment And Plan:     Encounter for general adult medical examination w/o abnormal findings  Essential hypertension  Other abnormal glucose  Vitamin D deficiency     No follow-ups on file. Patient was given opportunity to ask questions. Patient verbalized understanding of the plan and was able to repeat key elements of the plan. All questions were answered to their satisfaction.   Gwynneth Aliment, MD  I, Gwynneth Aliment, MD, have reviewed all documentation for this visit. The documentation on 01/18/23 for the exam, diagnosis, procedures, and orders are all accurate and complete.

## 2023-01-31 ENCOUNTER — Other Ambulatory Visit (HOSPITAL_COMMUNITY): Payer: Self-pay

## 2023-01-31 ENCOUNTER — Other Ambulatory Visit: Payer: Self-pay

## 2023-02-22 DIAGNOSIS — Z029 Encounter for administrative examinations, unspecified: Secondary | ICD-10-CM

## 2023-03-02 ENCOUNTER — Telehealth: Payer: Self-pay | Admitting: Internal Medicine

## 2023-03-02 NOTE — Telephone Encounter (Signed)
Pt was called to figure out information needed to complete disability paperwork. Was unable to leave VM, VM is full.

## 2023-03-15 ENCOUNTER — Ambulatory Visit: Payer: Commercial Managed Care - PPO | Admitting: Nurse Practitioner

## 2023-03-30 ENCOUNTER — Other Ambulatory Visit (HOSPITAL_COMMUNITY): Payer: Self-pay

## 2023-05-17 ENCOUNTER — Other Ambulatory Visit (HOSPITAL_COMMUNITY): Payer: Self-pay

## 2023-05-17 ENCOUNTER — Other Ambulatory Visit: Payer: Self-pay

## 2023-06-10 ENCOUNTER — Other Ambulatory Visit (HOSPITAL_COMMUNITY): Payer: Self-pay

## 2023-06-13 ENCOUNTER — Other Ambulatory Visit (HOSPITAL_COMMUNITY): Payer: Self-pay

## 2023-09-19 ENCOUNTER — Other Ambulatory Visit (HOSPITAL_COMMUNITY): Payer: Self-pay

## 2023-09-19 ENCOUNTER — Other Ambulatory Visit: Payer: Self-pay | Admitting: Internal Medicine

## 2023-09-19 ENCOUNTER — Other Ambulatory Visit: Payer: Self-pay

## 2023-09-19 DIAGNOSIS — I10 Essential (primary) hypertension: Secondary | ICD-10-CM

## 2023-09-19 MED ORDER — OLMESARTAN MEDOXOMIL-HCTZ 20-12.5 MG PO TABS
1.0000 | ORAL_TABLET | Freq: Every day | ORAL | 1 refills | Status: DC
  Filled 2023-09-19: qty 90, 90d supply, fill #0
  Filled 2024-01-19: qty 90, 90d supply, fill #1
  Filled 2024-01-23: qty 30, 30d supply, fill #1
  Filled 2024-03-07: qty 30, 30d supply, fill #2
  Filled 2024-04-23 – 2024-04-25 (×2): qty 30, 30d supply, fill #3

## 2023-12-02 ENCOUNTER — Other Ambulatory Visit (HOSPITAL_COMMUNITY): Payer: Self-pay

## 2023-12-02 ENCOUNTER — Other Ambulatory Visit: Payer: Self-pay | Admitting: Internal Medicine

## 2023-12-02 DIAGNOSIS — R252 Cramp and spasm: Secondary | ICD-10-CM

## 2023-12-05 ENCOUNTER — Other Ambulatory Visit (HOSPITAL_COMMUNITY): Payer: Self-pay

## 2023-12-05 ENCOUNTER — Other Ambulatory Visit: Payer: Self-pay

## 2023-12-05 MED ORDER — CYCLOBENZAPRINE HCL 10 MG PO TABS
10.0000 mg | ORAL_TABLET | Freq: Three times a day (TID) | ORAL | 0 refills | Status: AC | PRN
  Filled 2023-12-05 (×2): qty 30, 10d supply, fill #0

## 2024-01-06 IMAGING — CR DG CHEST 2V
2 series · 2 of 2 positions shown · non-contrast
Comparison: 05/26/18

CLINICAL DATA: Persistent cough and shortness of breath for several
weeks, initial encounter

EXAM:
CHEST - 2 VIEW

[w chest pa]
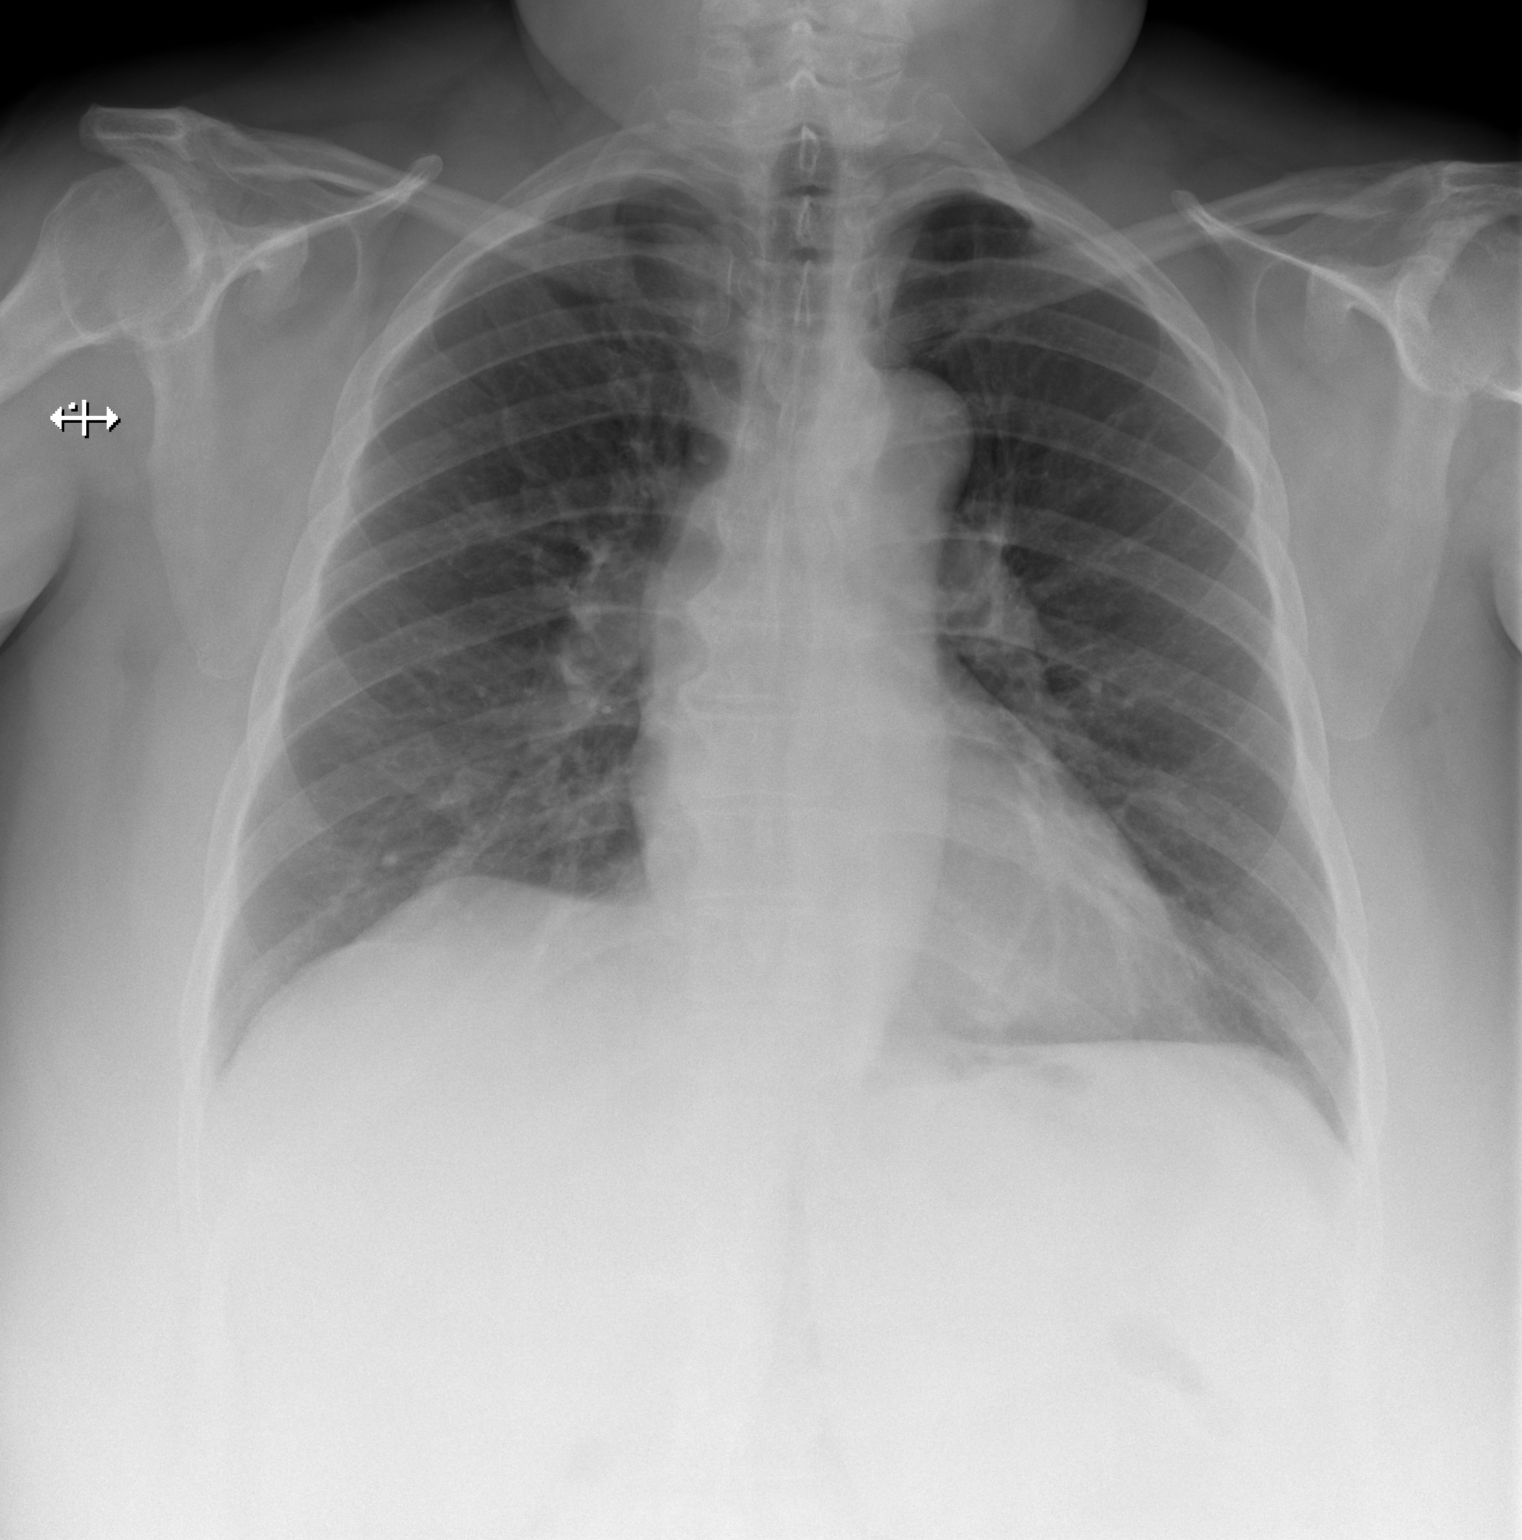

[w chest lat]
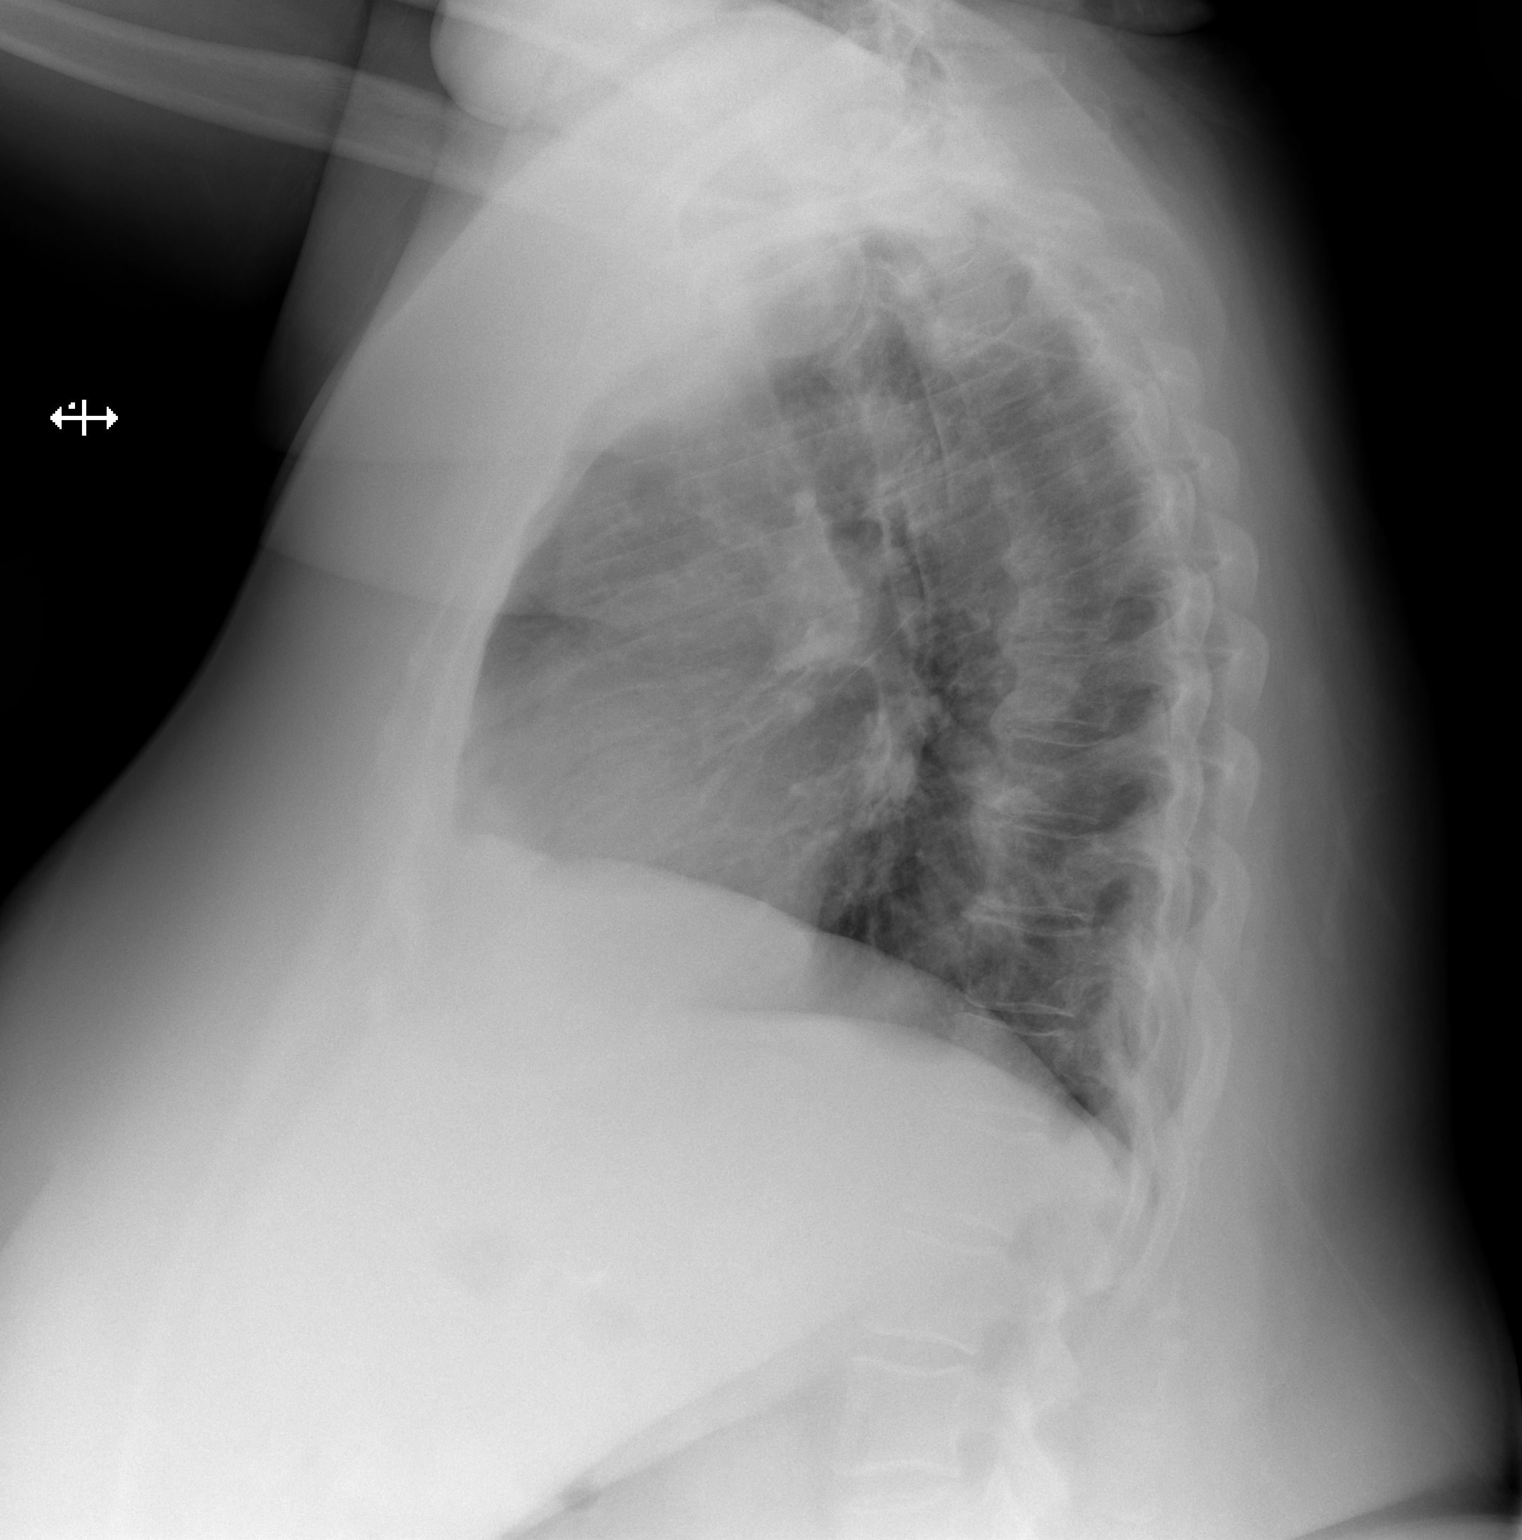

[2 of 2 positions shown; findings below may reference images not displayed]

FINDINGS: The heart size and mediastinal contours are within normal limits.
Both lungs are clear. The visualized skeletal structures are
unremarkable.
IMPRESSION: No active cardiopulmonary disease.

## 2024-01-19 ENCOUNTER — Encounter: Payer: Self-pay | Admitting: Pharmacist

## 2024-01-19 ENCOUNTER — Other Ambulatory Visit (HOSPITAL_COMMUNITY): Payer: Self-pay

## 2024-01-19 ENCOUNTER — Other Ambulatory Visit: Payer: Self-pay

## 2024-01-23 ENCOUNTER — Other Ambulatory Visit (HOSPITAL_COMMUNITY): Payer: Self-pay

## 2024-01-24 ENCOUNTER — Other Ambulatory Visit: Payer: Self-pay

## 2024-03-07 ENCOUNTER — Other Ambulatory Visit (HOSPITAL_COMMUNITY): Payer: Self-pay

## 2024-04-23 ENCOUNTER — Other Ambulatory Visit (HOSPITAL_COMMUNITY): Payer: Self-pay

## 2024-04-24 ENCOUNTER — Other Ambulatory Visit: Payer: Self-pay

## 2024-04-24 ENCOUNTER — Encounter: Payer: Self-pay | Admitting: Pharmacist

## 2024-04-25 ENCOUNTER — Other Ambulatory Visit (HOSPITAL_COMMUNITY): Payer: Self-pay

## 2024-04-26 ENCOUNTER — Other Ambulatory Visit: Payer: Self-pay

## 2024-05-25 ENCOUNTER — Other Ambulatory Visit (HOSPITAL_COMMUNITY): Payer: Self-pay

## 2024-05-25 ENCOUNTER — Other Ambulatory Visit: Payer: Self-pay | Admitting: Internal Medicine

## 2024-05-25 DIAGNOSIS — I10 Essential (primary) hypertension: Secondary | ICD-10-CM

## 2024-05-28 ENCOUNTER — Other Ambulatory Visit (HOSPITAL_COMMUNITY): Payer: Self-pay

## 2024-05-29 ENCOUNTER — Other Ambulatory Visit (HOSPITAL_COMMUNITY): Payer: Self-pay

## 2024-05-29 ENCOUNTER — Other Ambulatory Visit: Payer: Self-pay | Admitting: Internal Medicine

## 2024-05-29 DIAGNOSIS — I10 Essential (primary) hypertension: Secondary | ICD-10-CM

## 2024-05-30 ENCOUNTER — Ambulatory Visit (INDEPENDENT_AMBULATORY_CARE_PROVIDER_SITE_OTHER): Payer: Self-pay | Admitting: Internal Medicine

## 2024-05-30 ENCOUNTER — Other Ambulatory Visit: Payer: Self-pay

## 2024-05-30 ENCOUNTER — Other Ambulatory Visit (HOSPITAL_COMMUNITY): Payer: Self-pay

## 2024-05-30 ENCOUNTER — Encounter: Payer: Self-pay | Admitting: Internal Medicine

## 2024-05-30 ENCOUNTER — Encounter: Payer: Self-pay | Admitting: Pharmacist

## 2024-05-30 VITALS — BP 110/74 | HR 86 | Temp 98.3°F | Ht 65.0 in | Wt 345.8 lb

## 2024-05-30 DIAGNOSIS — I10 Essential (primary) hypertension: Secondary | ICD-10-CM

## 2024-05-30 DIAGNOSIS — Z1231 Encounter for screening mammogram for malignant neoplasm of breast: Secondary | ICD-10-CM

## 2024-05-30 DIAGNOSIS — J069 Acute upper respiratory infection, unspecified: Secondary | ICD-10-CM

## 2024-05-30 DIAGNOSIS — R7309 Other abnormal glucose: Secondary | ICD-10-CM

## 2024-05-30 MED ORDER — OLMESARTAN MEDOXOMIL-HCTZ 20-12.5 MG PO TABS
1.0000 | ORAL_TABLET | Freq: Every day | ORAL | 1 refills | Status: DC
  Filled 2024-05-30: qty 90, 90d supply, fill #0

## 2024-05-30 MED ORDER — OLMESARTAN MEDOXOMIL-HCTZ 20-12.5 MG PO TABS
1.0000 | ORAL_TABLET | Freq: Every day | ORAL | 1 refills | Status: AC
  Filled 2024-05-30: qty 5, 5d supply, fill #0
  Filled 2024-05-30: qty 85, 85d supply, fill #0
  Filled 2024-05-30: qty 90, 90d supply, fill #0
  Filled 2024-05-30: qty 5, 5d supply, fill #0
  Filled 2024-05-30: qty 90, 90d supply, fill #0
  Filled 2024-05-30: qty 5, 5d supply, fill #0

## 2024-05-30 NOTE — Patient Instructions (Signed)
 Hypertension, Adult Hypertension is another name for high blood pressure. High blood pressure forces your heart to work harder to pump blood. This can cause problems over time. There are two numbers in a blood pressure reading. There is a top number (systolic) over a bottom number (diastolic). It is best to have a blood pressure that is below 120/80. What are the causes? The cause of this condition is not known. Some other conditions can lead to high blood pressure. What increases the risk? Some lifestyle factors can make you more likely to develop high blood pressure: Smoking. Not getting enough exercise or physical activity. Being overweight. Having too much fat, sugar, calories, or salt (sodium) in your diet. Drinking too much alcohol. Other risk factors include: Having any of these conditions: Heart disease. Diabetes. High cholesterol. Kidney disease. Obstructive sleep apnea. Having a family history of high blood pressure and high cholesterol. Age. The risk increases with age. Stress. What are the signs or symptoms? High blood pressure may not cause symptoms. Very high blood pressure (hypertensive crisis) may cause: Headache. Fast or uneven heartbeats (palpitations). Shortness of breath. Nosebleed. Vomiting or feeling like you may vomit (nauseous). Changes in how you see. Very bad chest pain. Feeling dizzy. Seizures. How is this treated? This condition is treated by making healthy lifestyle changes, such as: Eating healthy foods. Exercising more. Drinking less alcohol. Your doctor may prescribe medicine if lifestyle changes do not help enough and if: Your top number is above 130. Your bottom number is above 80. Your personal target blood pressure may vary. Follow these instructions at home: Eating and drinking  If told, follow the DASH eating plan. To follow this plan: Fill one half of your plate at each meal with fruits and vegetables. Fill one fourth of your plate  at each meal with whole grains. Whole grains include whole-wheat pasta, brown rice, and whole-grain bread. Eat or drink low-fat dairy products, such as skim milk or low-fat yogurt. Fill one fourth of your plate at each meal with low-fat (lean) proteins. Low-fat proteins include fish, chicken without skin, eggs, beans, and tofu. Avoid fatty meat, cured and processed meat, or chicken with skin. Avoid pre-made or processed food. Limit the amount of salt in your diet to less than 1,500 mg each day. Do not drink alcohol if: Your doctor tells you not to drink. You are pregnant, may be pregnant, or are planning to become pregnant. If you drink alcohol: Limit how much you have to: 0-1 drink a day for women. 0-2 drinks a day for men. Know how much alcohol is in your drink. In the [place] one drink equals one 12 oz bottle of beer (355 mL), one 5 oz glass of wine (148 mL), or one 1 oz glass of hard liquor (44 mL). Lifestyle  Work with your doctor to stay at a healthy weight or to lose weight. Ask your doctor what the best weight is for you. Get at least 30 minutes of exercise that causes your heart to beat faster (aerobic exercise) most days of the week. This may include walking, swimming, or biking. Get at least 30 minutes of exercise that strengthens your muscles (resistance exercise) at least 3 days a week. This may include lifting weights or doing Pilates. Do not smoke or use any products that contain nicotine or tobacco. If you need help quitting, ask your doctor. Check your blood pressure at home as told by your doctor. Keep all follow-up visits. Medicines Take over-the-counter and prescription medicines  only as told by your doctor. Follow directions carefully. Do not skip doses of blood pressure medicine. The medicine does not work as well if you skip doses. Skipping doses also puts you at risk for problems. Ask your doctor about side effects or reactions to medicines that you should watch  for. Contact a doctor if: You think you are having a reaction to the medicine you are taking. You have headaches that keep coming back. You feel dizzy. You have swelling in your ankles. You have trouble with your vision. Get help right away if: You get a very bad headache. You start to feel mixed up (confused). You feel weak or numb. You feel faint. You have very bad pain in your: Chest. Belly (abdomen). You vomit more than once. You have trouble breathing. These symptoms may be an emergency. Get help right away. Call 911. Do not wait to see if the symptoms will go away. Do not drive yourself to the hospital. Summary Hypertension is another name for high blood pressure. High blood pressure forces your heart to work harder to pump blood. For most people, a normal blood pressure is less than 120/80. Making healthy choices can help lower blood pressure. If your blood pressure does not get lower with healthy choices, you may need to take medicine. This information is not intended to replace advice given to you by your health care provider. Make sure you discuss any questions you have with your health care provider. Document Revised: 03/26/2021 Document Reviewed: 03/26/2021 Elsevier Patient Education  2024 [employer].

## 2024-05-30 NOTE — Progress Notes (Signed)
 I,Victoria T Emmitt, CMA,acting as a neurosurgeon for Catheryn LOISE Slocumb, MD.,have documented all relevant documentation on the behalf of Catheryn LOISE Slocumb, MD,as directed by  Catheryn LOISE Slocumb, MD while in the presence of Catheryn LOISE Slocumb, MD.  Subjective:  Patient ID: Donna Crawford , female    DOB: Jun 17, 1961 , 63 y.o.   MRN: [id number]  Chief Complaint  Patient presents with   Hypertension    Patient presents today for bpc. She states needing a refill, refill sent. Denies headache, chest pain & sob. She currently is not employed. 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 She states in Jan is when she will settle to find a job. She is also currently uninsured.  She would like to discuss weight loss options. The cheaper option being she will be paying out of pocket.  She has not completed mammo/colonoscopy.     HPI Discussed the use of AI scribe software for clinical note transcription with the patient, who gave verbal consent to proceed.  History of Present Illness Donna Crawford is a 62 year old female with hypertension who presents for a blood pressure check and medication refills.  She has been experiencing a recent respiratory issue characterized by a cough with yellowish sputum. She performed an over-the-counter COVID test this past weekend, which was negative. She suspects she caught a bug from her nephews during Thanksgiving. No fever or chills, and she describes her symptoms as similar to a cold. She has been managing her symptoms with cough syrup, increased fluid intake, and rest. She is not taking any medication for sinus drainage.  She is currently not taking glucosamine due to its cost but is considering starting vitamin D  supplements. She is on a variety of other supplements when affordable. She is taking olmesartan , with a 90-day supply costing approximately eighteen dollars.  She has not had lab work since 2024. She is currently uninsured due to reduced work hours while attending school but  plans to apply for full-time work in January to obtain insurance. 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  She received a flu shot recently, required for school, and has a picture of the documentation. She uses [facility] for her prescriptions, which she either picks up or has shipped to her.  She has been behind on her mammogram and is interested in applying for a scholarship program to cover the cost.   Hypertension This is a chronic problem. The current episode started more than 1 year ago. The problem is controlled. Associated symptoms include peripheral edema. Pertinent negatives include no chest pain, headaches, palpitations or shortness of breath. (Contributed to being on her feet for 12 hrs per day and is relieved by elevation.) There are no associated agents to hypertension. Risk factors for coronary artery disease include sedentary lifestyle and obesity. Past treatments include diuretics and angiotensin blockers. The current treatment provides moderate improvement. There are no compliance problems.  There is no history of angina. There is no history of chronic renal disease.     Past Medical History:  Diagnosis Date   H/O varicella    History of measles, mumps, or rubella    Hypertension    Non-atypical endometrial cells on cervical Pap smear 2008     Family History  Problem Relation Age of Onset   Diabetes Mother    Hypertension Mother    Cancer  Father    Heart disease Father    Hypertension Father    Stroke Father    Hypertension Sister    Cancer Sister    Diabetes Brother    Hypertension Brother    Diabetes Sister    Hypertension Sister      Current Outpatient Medications:    Ascorbic Acid (VITAMIN C PO), Take by mouth., Disp: , Rfl:    azithromycin  (ZITHROMAX ) 250 MG tablet, Take 2 tablets (500 mg) on  Day 1,  followed by 1 tablet  (250 mg) once daily on Days 2 through 5., Disp: 6 each, Rfl: 0   Cholecalciferol  (VITAMIN D ) 50 MCG (2000 UT) tablet, Take 1 tablet (2,000 Units total) by mouth daily., Disp: 90 tablet, Rfl: 0   Cyanocobalamin (B-12 PO), Take by mouth., Disp: , Rfl:    cyclobenzaprine  (FLEXERIL ) 10 MG tablet, Take 1 tablet (10 mg total) by mouth 3 (three) times daily as needed for muscle spasms., Disp: 30 tablet, Rfl: 0   Melatonin 3 MG TABS, Take by mouth., Disp: , Rfl:    Multiple Vitamins-Minerals (ZINC PO), Take by mouth., Disp: , Rfl:    olmesartan -hydrochlorothiazide  (BENICAR  HCT) 20-12.5 MG tablet, Take 1 tablet by mouth daily., Disp: 90 tablet, Rfl: 1   Omega-3 Fatty Acids (OMEGA 3 PO), Take by mouth., Disp: , Rfl:    fluticasone  (FLONASE ) 50 MCG/ACT nasal spray, Place 2 sprays into both nostrils daily. (Patient not taking: Reported on 05/30/2024), Disp: 16 g, Rfl: 2   glucosamine-chondroitin 500-400 MG tablet, Take 1 tablet by mouth 1 day or 1 dose. (Patient not taking: Reported on 05/30/2024), Disp: 90 tablet, Rfl: 0   No Known Allergies   Review of Systems  Constitutional: Negative.   HENT:  Positive for congestion.   Respiratory: Negative.  Negative for shortness of breath.   Cardiovascular: Negative.  Negative for chest pain and palpitations.  Gastrointestinal: Negative.   Neurological: Negative.  Negative for headaches.  Psychiatric/Behavioral: Negative.       Today's Vitals   05/30/24 1636  BP: 110/74  Pulse: 86  Temp: 98.3 F (36.8 C)  SpO2: 98%  Weight: (!) 345 lb 12.8 oz (156.9 kg)  Height: 5' 5 (1.651 m)   Body mass index is 57.54 kg/m.  Wt Readings from Last 3 Encounters:  05/30/24 (!) 345 lb 12.8 oz (156.9 kg)  08/25/22 (!) 331 lb 6.4 oz (150.3 kg)  01/11/22 (!) 329 lb 6.4 oz (149.4 kg)    The 10-year ASCVD risk score (Arnett DK, et al., 2019) is: 5.5%   Values used to calculate the score:     Age: 66 years     Clinically relevant sex: Female     Is Non-Hispanic  African American: Yes     Diabetic: No     Tobacco smoker: No     Systolic Blood Pressure: 110 mmHg     Is BP treated: Yes     HDL Cholesterol: 47 mg/dL     Total Cholesterol: 215 mg/dL  Objective:  Physical Exam Vitals and nursing note reviewed.  Constitutional:      Appearance: Normal appearance. She is obese.  HENT:     Head: Normocephalic and atraumatic.  Eyes:     Extraocular Movements: Extraocular movements intact.  Cardiovascular:     Rate and Rhythm: Normal rate and regular rhythm.     Heart sounds: Normal heart sounds.  Pulmonary:     Effort: Pulmonary effort is normal.     Breath  sounds: Normal breath sounds.  Musculoskeletal:     Cervical back: Normal range of motion.  Skin:    General: Skin is warm.  Neurological:     General: No focal deficit present.     Mental Status: She is alert.  Psychiatric:        Mood and Affect: Mood normal.        Behavior: Behavior normal.         Assessment And Plan:   Assessment & Plan Essential hypertension Chronic, well controlled.  Hypertension management requires monitoring of kidney function for safe medication dosing. - Ordered blood tests to assess kidney function. - Continue olmesartan  90-day supply. Encounter for screening mammogram for malignant neoplasm of breast Referred to breast center for mammogram. Encouraged to inquire about the scholarship program.  Other abnormal glucose Previous labs reviewed, her A1c has been elevated in the past. I will check an A1c today. Reminded to avoid refined sugars including sugary drinks/foods and processed meats including bacon, sausages and deli meats.   Upper respiratory tract infection, unspecified type Likely bacterial infection given symptoms and facial tenderness. - Recommended over-the-counter Coricidin for symptom relief. - Prescribed antibiotic for suspected bacterial infection. Morbid obesity due to excess calories (HCC) BMI 57.  She is encouraged to strive to  lose ten percent of her body weight to decrease cardiac risk.  - Consider MWM referral once she is insured - Decrease intake of sugary foods/beverages and packaged foods - Aim for at least 150 minutes of exercise per week.     Orders Placed This Encounter  Procedures   MM Digital Screening   CMP14+EGFR   Hemoglobin A1c     Return in 4 months (on 09/28/2024), or bp check.  Patient was given opportunity to ask questions. Patient verbalized understanding of the plan and was able to repeat key elements of the plan. All questions were answered to their satisfaction.    I, Catheryn LOISE Slocumb, MD, have reviewed all documentation for this visit. The documentation on 06/03/2024 for the exam, diagnosis, procedures, and orders are all accurate and complete.   IF YOU HAVE BEEN REFERRED TO A SPECIALIST, IT MAY TAKE 1-2 WEEKS TO SCHEDULE/PROCESS THE REFERRAL. IF YOU HAVE NOT HEARD FROM US /SPECIALIST IN TWO WEEKS, PLEASE GIVE US  A CALL AT [phone].

## 2024-05-31 ENCOUNTER — Other Ambulatory Visit: Payer: Self-pay

## 2024-05-31 LAB — CMP14+EGFR
ALT: 16 IU/L (ref 0–32)
AST: 16 IU/L (ref 0–40)
Albumin: 4 g/dL (ref 3.9–4.9)
Alkaline Phosphatase: 54 IU/L (ref 49–135)
BUN/Creatinine Ratio: 11 — ABNORMAL LOW (ref 12–28)
BUN: 10 mg/dL (ref 8–27)
Bilirubin Total: 0.4 mg/dL (ref 0.0–1.2)
CO2: 26 mmol/L (ref 20–29)
Calcium: 8.8 mg/dL (ref 8.7–10.3)
Chloride: 100 mmol/L (ref 96–106)
Creatinine, Ser: 0.94 mg/dL (ref 0.57–1.00)
Globulin, Total: 2.8 g/dL (ref 1.5–4.5)
Glucose: 91 mg/dL (ref 70–99)
Potassium: 3.8 mmol/L (ref 3.5–5.2)
Sodium: 140 mmol/L (ref 134–144)
Total Protein: 6.8 g/dL (ref 6.0–8.5)
eGFR: 69 mL/min/1.73 (ref >59)

## 2024-05-31 LAB — HEMOGLOBIN A1C
Est. average glucose Bld gHb Est-mCnc: 123 mg/dL
Hgb A1c MFr Bld: 5.9 % — ABNORMAL HIGH (ref 4.8–5.6)

## 2024-06-02 ENCOUNTER — Ambulatory Visit: Payer: Self-pay | Admitting: Internal Medicine

## 2024-06-03 MED ORDER — AZITHROMYCIN 250 MG PO TABS
ORAL_TABLET | ORAL | 0 refills | Status: AC
  Filled 2024-06-03: qty 6, 5d supply, fill #0

## 2024-06-03 NOTE — Assessment & Plan Note (Signed)
 Chronic, well controlled.  Hypertension management requires monitoring of kidney function for safe medication dosing. - Ordered blood tests to assess kidney function. - Continue olmesartan  90-day supply.

## 2024-06-03 NOTE — Assessment & Plan Note (Signed)
 BMI 57.  She is encouraged to strive to lose ten percent of her body weight to decrease cardiac risk.  - Consider MWM referral once she is insured - Decrease intake of sugary foods/beverages and packaged foods - Aim for at least 150 minutes of exercise per week.

## 2024-06-04 ENCOUNTER — Other Ambulatory Visit: Payer: Self-pay

## 2024-06-04 ENCOUNTER — Encounter: Payer: Self-pay | Admitting: Pharmacist

## 2024-06-04 ENCOUNTER — Other Ambulatory Visit (HOSPITAL_COMMUNITY): Payer: Self-pay

## 2024-10-03 ENCOUNTER — Ambulatory Visit: Payer: Self-pay | Admitting: Internal Medicine
